# Patient Record
Sex: Female | Born: 1995 | Race: Black or African American | Hispanic: No | Marital: Single
Health system: Midwestern US, Community
[De-identification: ages and names within clinical notes are randomized; demographics above are authoritative.]

## PROBLEM LIST (undated history)

## (undated) ENCOUNTER — Emergency Department (HOSPITAL_COMMUNITY): Payer: Medicaid Other

## (undated) DIAGNOSIS — D649 Anemia, unspecified: Secondary | ICD-10-CM

## (undated) DIAGNOSIS — E559 Vitamin D deficiency, unspecified: Secondary | ICD-10-CM

## (undated) DIAGNOSIS — A749 Chlamydial infection, unspecified: Secondary | ICD-10-CM

## (undated) DIAGNOSIS — O98311 Other infections with a predominantly sexual mode of transmission complicating pregnancy, first trimester: Secondary | ICD-10-CM

## (undated) DIAGNOSIS — G43109 Migraine with aura, not intractable, without status migrainosus: Secondary | ICD-10-CM

## (undated) DIAGNOSIS — Z3490 Encounter for supervision of normal pregnancy, unspecified, unspecified trimester: Secondary | ICD-10-CM

## (undated) DIAGNOSIS — D509 Iron deficiency anemia, unspecified: Secondary | ICD-10-CM

## (undated) HISTORY — PX: NO PAST SURGERIES: SHX2092

---
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
## 2016-01-02 ENCOUNTER — Ambulatory Visit
Admit: 2016-01-02 | Payer: MEDICAID | Attending: Sports Medicine | Primary: Student in an Organized Health Care Education/Training Program

## 2016-01-02 DIAGNOSIS — G43109 Migraine with aura, not intractable, without status migrainosus: Secondary | ICD-10-CM

## 2016-01-02 LAB — POCT URINALYSIS DIPSTICK
Bilirubin, UA: NEGATIVE
Glucose, UA POC: NEGATIVE
Ketones, UA: NEGATIVE
Leukocytes, UA: NEGATIVE
Nitrite, UA: NEGATIVE
Urobilinogen, UA: 0.2
pH, UA: 6.5

## 2016-01-02 MED ORDER — SUMATRIPTAN SUCCINATE 25 MG PO TABS
25 MG | ORAL_TABLET | Freq: Once | ORAL | 1 refills | Status: DC | PRN
Start: 2016-01-02 — End: 2017-10-28

## 2016-01-02 MED ORDER — PROPRANOLOL HCL ER 80 MG PO CP24
80 MG | ORAL_CAPSULE | Freq: Every day | ORAL | 2 refills | Status: DC
Start: 2016-01-02 — End: 2017-10-28

## 2016-01-02 NOTE — Progress Notes (Signed)
Previous visit notes and plan of care reviewed: YES  Verified patient is taking prescribed medications: Yes  PHQ-2/PHQ-9 screening done: Yes  Falls risk assessment done: No  Assess immunization status for current needs: No  Preventative patient care summary generated: No  Care opportunities discussed: No  MyChart activation discussed: Yes

## 2016-01-02 NOTE — Progress Notes (Signed)
Subjective    Thomasa TERETHA CHALUPA is a 20 y.o. female who presents:    Chief Complaint   Patient presents with   ??? Emesis     Resolved     Vomiting  - 6x overnight around 12/27/15  - Now resolved  - Associated headache  - Reports history of migraine headaches  - Associated with photophobia and phonophobia  - Occurring around twice per month  - Associated with aura  - Thinks they occur around the same time of month around when she is due for her depo-provera birth control shot  - Of note son had likely viral gastroenteritis around the same time  - No fever  - No dysuria but she reports increased frequency since birth or son 7 months ago  - Currently on period    ROS  Patient Active Problem List   Diagnosis   ??? Pregnancy   ??? Polydactyly of toes   ??? UTI (urinary tract infection) during pregnancy   ??? Chlamydia infection affecting pregnancy in first trimester, antepartum   ??? Insufficient weight gain in pregnancy in second trimester   ??? Vitamin D deficiency   ??? 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 Trichomonal vaginitis   ??? Iron deficiency anemia     No Known Allergies  Current Outpatient Prescriptions   Medication Sig Dispense Refill   ??? propranolol (INDERAL LA) 80 MG extended release capsule Take 1 capsule by mouth daily 30 capsule 2   ??? SUMAtriptan (IMITREX) 25 MG tablet Take 1 tablet by mouth once as needed for Migraine (may repeat in 2 hours if headache not improved) 9 tablet 1   ??? ibuprofen (ADVIL;MOTRIN) 600 MG tablet Take 1 tablet by mouth every 6 hours as needed for Pain 120 tablet 3   ??? ferrous sulfate (FE TABS) 325 (65 FE) MG EC tablet Take 1 tablet by mouth 2 times daily 90 tablet 3   ??? Prenatal Vit-Fe Fumarate-FA (PRENATAL VITAMINS) 28-0.8 MG TABS Take 1 tablet by mouth daily SUBSTITUTE GENERIC PRENATAL VITAMIN PER PT'S INSURANCE. 30 tablet 11     Current Facility-Administered Medications   Medication Dose Route Frequency Provider Last Rate Last Dose   ??? medroxyPROGESTERone  (DEPO-PROVERA) injection 150 mg  150 mg Intramuscular Q3 Months Jacquelyne Balint, DO   150 mg at 10/19/15 1339     Social History   Substance Use Topics   ??? Smoking status: Never Smoker   ??? Smokeless tobacco: Not on file   ??? Alcohol use No       Objective     Visit Vitals   ??? BP 110/79 (Site: Right Arm, Position: Sitting, Cuff Size: Medium Adult)   ??? Pulse 90   ??? Temp 97.2 ??F (36.2 ??C) (Temporal)   ??? Resp 26   ??? Ht  (1.753 m)   ??? Wt 181 lb (82.1 kg)   ??? BMI 26.73 kg/m2     Physical Exam   Constitutional: She is oriented to person, place, and time. No distress.   HENT:   Head: Normocephalic.   Mouth/Throat: Oropharynx is clear and moist.   Cardiovascular: Normal rate and regular rhythm.  Exam reveals no gallop.    No murmur heard.  Pulmonary/Chest: Effort normal. No respiratory distress.   Abdominal: Soft. There is no tenderness.   Neurological: She is alert and oriented to person, place, and time. No cranial nerve deficit.   Skin: Skin is warm and dry. No rash noted.  Psychiatric: She has a normal mood and affect.     Laboratory:  Results for orders placed or performed in visit on 01/02/16   POCT Urinalysis no Micro   Result Value Ref Range    Color, UA yellow     Clarity, UA cloudy     Glucose, UA POC neg     Bilirubin, UA neg     Ketones, UA neg     Spec Grav, UA      Blood, UA POC large     pH, UA 6.5     Protein, UA POC trace     Urobilinogen, UA 0.2     Leukocytes, UA neg     Nitrite, UA neg      Assessment/Plan     1. Migraine with aura and without status migrainosus, not intractable  - Will start on prophylactic migraine medication as patient having at least 2 migraine headaches weekly  - Likely hormonal related and correlated with cycle/depo shots.  Patient declined change in brith control today.  - Start propranolol (INDERAL LA) 80 MG extended release capsule; Take 1 capsule by mouth daily  Dispense: 30 capsule; Refill: 2  - Start SUMAtriptan (IMITREX) 25 MG tablet; Take 1 tablet by mouth once as needed  for Migraine (may repeat in 2 hours if headache not improved)  Dispense: 9 tablet; Refill: 1    2. Non-intractable vomiting with nausea, unspecified vomiting type  - Due to above    3. Urinary frequency  - POCT Urinalysis no Micro  - Positive for blood and protein  - Currently on period    [place] Shanterria Franta, DO  01/02/16  1:14 PM

## 2016-01-02 NOTE — Patient Instructions (Signed)
Migraine Headache: Care Instructions  Your Care Instructions  Migraines are painful, throbbing headaches that often start on one side of the head. They may cause nausea and vomiting and make you sensitive to light, sound, or smell.  Without treatment, migraines can last from 4 hours to a few days. Medicines can help prevent migraines or stop them after they have started. Your doctor can help you find which ones work best for you.  Follow-up care is a key part of your treatment and safety. Be sure to make and go to all appointments, and call your doctor if you are having problems. It's also a good idea to know your test results and keep a list of the medicines you take.  How can you care for yourself at home?  ?? Do not drive if you have taken a prescription pain medicine.  ?? Rest in a quiet, dark room until your headache is gone. Close your eyes, and try to relax or go to sleep. Don't watch TV or read.  ?? Put a cold, moist cloth or cold pack on the painful area for 10 to 20 minutes at a time. Put a thin cloth between the cold pack and your skin.  ?? Use a warm, moist towel or a heating pad set on low to relax tight shoulder and neck muscles.  ?? Have someone gently massage your neck and shoulders.  ?? Take your medicines exactly as prescribed. Call your doctor if you think you are having a problem with your medicine. You will get more details on the specific medicines your doctor prescribes.  ?? Be careful not to take pain medicine more often than the instructions allow. You could get worse or more frequent headaches when the medicine wears off.  To prevent migraines  ?? Keep a headache diary so you can figure out what triggers your headaches. Avoiding triggers may help you prevent headaches. Record when each headache began, how long it lasted, and what the pain was like. (Was it throbbing, aching, stabbing, or dull?) Write down any other symptoms you had with the headache, such as nausea, flashing lights or dark  spots, or sensitivity to bright light or loud noise. Note if the headache occurred near your period. List anything that might have triggered the headache. Triggers may include certain foods (chocolate, cheese, wine) or odors, smoke, bright light, stress, or lack of sleep.  ?? If your doctor has prescribed medicine for your migraines, take it as directed. You may have medicine that you take only when you get a migraine and medicine that you take all the time to help prevent migraines.  ?? If your doctor has prescribed medicine for when you get a headache, take it at the first sign of a migraine, unless your doctor has given you other instructions.  ?? If your doctor has prescribed medicine to prevent migraines, take it exactly as prescribed. Call your doctor if you think you are having a problem with your medicine.  ?? Find healthy ways to deal with stress. Migraines are most common during or right after stressful times. Take time to relax before and after you do something that has caused a migraine in the past.  ?? Try to keep your muscles relaxed by keeping good posture. Check your jaw, face, neck, and shoulder muscles for tension. Try to relax them. When you sit at a desk, change positions often. And make sure to stretch for 30 seconds each hour.  ?? Get plenty of sleep and   exercise.  ?? Eat meals on a regular schedule. Avoid foods and drinks that often trigger migraines. These include chocolate, alcohol (especially red wine and port), aspartame, monosodium glutamate (MSG), and some additives found in foods (such as hot dogs, bacon, cold cuts, aged cheeses, and pickled foods).  ?? Limit caffeine. Don't drink too much coffee, tea, or soda. But don't quit caffeine suddenly. That can also give you migraines.  ?? Do not smoke or allow others to smoke around you. If you need help quitting, talk to your doctor about stop-smoking programs and medicines. These can increase your chances of quitting for good.  ?? If you are taking  birth control pills or hormone therapy, talk to your doctor about whether they are triggering your migraines.  When should you call for help?  Call 911 anytime you think you may need emergency care. For example, call if:  ?? You have signs of a stroke. These may include:  ?? Sudden numbness, paralysis, or weakness in your face, arm, or leg, especially on only one side of your body.  ?? Sudden vision changes.  ?? Sudden trouble speaking.  ?? Sudden confusion or trouble understanding simple statements.  ?? Sudden problems with walking or balance.  ?? A sudden, severe headache that is different from past headaches.  Call your doctor now or seek immediate medical care if:  ?? You have new or worse nausea and vomiting.  ?? You have a new or higher fever.  ?? Your headache gets much worse.  Watch closely for changes in your health, and be sure to contact your doctor if:  ?? You are not getting better after 2 days (48 hours).  Where can you learn more?  Go to [email or web address] and sign in to your MyChart account. Enter U690 in the Search Health Information box to learn more about "Migraine Headache: Care Instructions."     If you do not have an account, please click on the "Sign Up Now" link.  Current as of: December 31, 2014  Content Version: 11.1  ?? 2006-2016 [employer]. Care instructions adapted under license by [facility]. If you have questions about a medical condition or this instruction, always ask your healthcare professional. [employer] disclaims any warranty or liability for your use of this information.

## 2016-01-03 NOTE — Progress Notes (Signed)
Resident note reviewed. Agree with plan.

## 2016-01-04 ENCOUNTER — Encounter: Primary: Student in an Organized Health Care Education/Training Program

## 2016-01-11 ENCOUNTER — Encounter: Primary: Student in an Organized Health Care Education/Training Program

## 2016-01-11 NOTE — Progress Notes (Signed)
Pt advised to increase Ca+ in diet thru supplements or diet. Next depo appointment scheduled 12 weeks from now.

## 2016-03-28 ENCOUNTER — Encounter: Primary: Student in an Organized Health Care Education/Training Program

## 2016-04-20 ENCOUNTER — Encounter
Admit: 2016-04-20 | Discharge: 2016-04-20 | Payer: MEDICAID | Primary: Student in an Organized Health Care Education/Training Program

## 2016-04-20 DIAGNOSIS — Z3042 Encounter for surveillance of injectable contraceptive: Secondary | ICD-10-CM

## 2016-04-20 LAB — POCT URINE PREGNANCY: Preg Test, Ur: NEGATIVE

## 2016-04-20 NOTE — Other (Unsigned)
Patient Acct Nbr: [id number]   Primary AUTH/CERT:   Primary Insurance Company Name: [employer]  Primary Insurance Plan name: [facility]  Primary Insurance Group Number:   Primary Insurance Plan Type: Health  Primary Insurance Policy Number: [id number]

## 2016-04-20 NOTE — Progress Notes (Signed)
Pt is late by 22 days for Depo Provera injection. Quick start protocol. LMP 6/2-6/7. Protected intercourse. Negative HCG test today. Return in 2 weeks for repeat HCG urine pregnancy test. Pt stated understanding. JLR

## 2016-05-03 ENCOUNTER — Encounter: Primary: Student in an Organized Health Care Education/Training Program

## 2016-05-09 MED FILL — MEDROXYPROGESTERONE ACETATE 150 MG/ML IM SUSP: 150 MG/ML | INTRAMUSCULAR | Qty: 1

## 2016-07-06 ENCOUNTER — Encounter: Primary: Student in an Organized Health Care Education/Training Program

## 2016-07-12 ENCOUNTER — Encounter
Admit: 2016-07-12 | Discharge: 2016-07-12 | Payer: MEDICAID | Primary: Student in an Organized Health Care Education/Training Program

## 2016-07-12 DIAGNOSIS — Z3042 Encounter for surveillance of injectable contraceptive: Secondary | ICD-10-CM

## 2016-07-12 MED ORDER — MEDROXYPROGESTERONE ACETATE 150 MG/ML IM SUSP
150 | INTRAMUSCULAR | Status: AC
Start: 2016-07-12 — End: 2016-07-12

## 2016-07-12 MED FILL — MEDROXYPROGESTERONE ACETATE 150 MG/ML IM SUSP: 150 MG/ML | INTRAMUSCULAR | Qty: 1

## 2016-07-12 NOTE — Progress Notes (Signed)
Patient here for depo injection next due November 16-October 11, 2016 appt scheduled patient voiced no questions or concerns

## 2016-07-12 NOTE — Other (Unsigned)
Patient Acct Nbr: [id number]   Primary AUTH/CERT:   Primary Insurance Company Name: [employer]  Primary Insurance Plan name: [facility]  Primary Insurance Group Number:   Primary Insurance Plan Type: Health  Primary Insurance Policy Number: [id number]

## 2016-08-07 ENCOUNTER — Inpatient Hospital Stay: Admit: 2016-08-07 | Discharge: 2016-08-07 | Disposition: A | Discharge status: Home / Self Care

## 2016-08-07 ENCOUNTER — Encounter

## 2016-08-07 DIAGNOSIS — J309 Allergic rhinitis, unspecified: Secondary | ICD-10-CM

## 2016-08-07 MED ORDER — IBUPROFEN 600 MG PO TABS
600 MG | Freq: Once | ORAL | Status: DC
Start: 2016-08-07 — End: 2016-08-07

## 2016-08-07 MED ORDER — FERROUS SULFATE 325 (65 FE) MG PO TBEC
325 | ORAL_TABLET | Freq: Two times a day (BID) | ORAL | 3 refills | Status: DC
Start: 2016-08-07 — End: 2017-10-30

## 2016-08-07 MED ORDER — CETIRIZINE HCL 10 MG PO TABS
10 MG | Freq: Every day | ORAL | Status: DC
Start: 2016-08-07 — End: 2016-08-07

## 2016-08-07 MED ORDER — OXYMETAZOLINE HCL 0.05 % NA SOLN
0.05 % | Freq: Once | NASAL | Status: DC
Start: 2016-08-07 — End: 2016-08-07

## 2016-08-07 MED ORDER — LORATADINE 10 MG PO TABS
10 MG | ORAL_TABLET | Freq: Every day | ORAL | 0 refills | Status: AC
Start: 2016-08-07 — End: 2016-08-22

## 2016-08-07 MED ORDER — GUAIFENESIN 400 MG PO TABS
400 MG | ORAL_TABLET | Freq: Four times a day (QID) | ORAL | 0 refills | Status: AC | PRN
Start: 2016-08-07 — End: 2016-08-12

## 2016-08-07 MED ORDER — IBUPROFEN 600 MG PO TABS
600 MG | ORAL_TABLET | Freq: Four times a day (QID) | ORAL | 0 refills | Status: DC | PRN
Start: 2016-08-07 — End: 2017-10-30

## 2016-08-07 MED ORDER — GUAIFENESIN ER 600 MG PO TB12
600 MG | Freq: Two times a day (BID) | ORAL | Status: DC
Start: 2016-08-07 — End: 2016-08-07

## 2016-08-07 MED ORDER — PRENATAL VITAMINS 28-0.8 MG PO TABS
28-0.8 | ORAL_TABLET | Freq: Every day | ORAL | 11 refills | Status: DC
Start: 2016-08-07 — End: 2017-10-30

## 2016-08-07 NOTE — ED Notes (Signed)
Bed: 02  Expected date:   Expected time:   Means of arrival:   Comments:  Triage: Barbera Setters A. Luanna Salk, RN  08/07/16 [phone]

## 2016-08-07 NOTE — Telephone Encounter (Signed)
Name of Caller: [name]    Relationship to Patient: patient    Contact phone number:  [phone]    Medication Name:Prenatal Vit-Fe Fumarate-FA (PRENATAL VITAMINS) , ferrous sulfate (FE TABS) 325 (65 FE) MG EC tablet    Ordering Physician: Lucretia RoersWood    Pharmacy Name:  [facility]     Pharmacy Address:  Verified     Pharmacy Phone Number: [phone]    Updated/Validated Preferred Pharmacy: Yes    Patient instructed to contact the pharmacy prior to picking up the medication: Yes    Patient advised that office/PCP has 24-48 business hours to return their call: Yes

## 2016-08-07 NOTE — ED Notes (Signed)
Patient discharged home in no disress and without complaints.  Patient given prescriptions and follow up instructions. Patient with no questions at time of dischare     Lonzo CloudScott M. Ladona Ridgelaylor, RN  08/07/16 [phone]

## 2016-08-07 NOTE — Other (Unsigned)
Patient Acct Nbr: [id number]   Primary AUTH/CERT:   Primary Insurance Company Name: [employer]  Primary [facility] Plan [facility]: [facility]  Primary Insurance Group Number:   Primary Insurance Plan Type: Health  Primary Insurance Policy Number: [id number]

## 2016-08-07 NOTE — Telephone Encounter (Signed)
Escript sent.  Please call patient to schedule a wellness visit with PCP ( or [facility]) within 3 months.  Thank you

## 2016-08-07 NOTE — Telephone Encounter (Signed)
Patient scheduled 09/18/16

## 2016-08-07 NOTE — Discharge Instructions (Signed)
Allergies: Care Instructions  Your Care Instructions  Allergies occur when your body's defense system (immune system) overreacts to certain substances. The immune system treats a harmless substance as if it were a harmful germ or virus. Many things can cause this overreaction, including pollens, medicine, food, dust, animal dander, and mold.  Allergies can be mild or severe. Mild allergies can be managed with home treatment. But medicine may be needed to prevent problems.  Managing your allergies is an important part of staying healthy. Your doctor may suggest that you have allergy testing to help find out what is causing your allergies. When you know what things trigger your symptoms, you can avoid them. This can prevent allergy symptoms and other health problems.  For severe allergies that cause reactions that affect your whole body (anaphylactic reactions), your doctor may prescribe a shot of epinephrine to carry with you in case you have a severe reaction. Learn how to give yourself the shot and keep it with you at all times. Make sure it is not expired.  Follow-up care is a key part of your treatment and safety. Be sure to make and go to all appointments, and call your doctor if you are having problems. It's also a good idea to know your test results and keep a list of the medicines you take.  How can you care for yourself at home?   If you have been told by your doctor that dust or dust mites are causing your allergy, decrease the dust around your bed:   Wash sheets, pillowcases, and other bedding in hot water every week.   Use dust-proof covers for pillows, duvets, and mattresses. Avoid plastic covers because they tear easily and do not "breathe." Wash as instructed on the label.   Do not use any blankets and pillows that you do not need.   Use blankets that you can wash in your washing machine.   Consider removing drapes and carpets, which attract and hold dust, from your bedroom.   If you are  allergic to house dust and mites, do not use home humidifiers. Your doctor can suggest ways you can control dust and mites.   Look for signs of cockroaches. Cockroaches cause allergic reactions. Use cockroach baits to get rid of them. Then, clean your home well. Cockroaches like areas where grocery bags, newspapers, empty bottles, or cardboard boxes are stored. Do not keep these inside your home, and keep trash and food containers sealed. Seal off any spots where cockroaches might enter your home.   If you are allergic to mold, get rid of furniture, rugs, and drapes that smell musty. Check for mold in the bathroom.   If you are allergic to outdoor pollen or mold spores, use air-conditioning. Change or clean all filters every month. Keep windows closed.   If you are allergic to pollen, stay inside when pollen counts are high. Use a vacuum cleaner with a HEPA filter or a double-thickness filter at least two times each week.   Stay inside when air pollution is bad. Avoid paint fumes, perfumes, and other strong odors.   Avoid conditions that make your allergies worse. Stay away from smoke. Do not smoke or let anyone else smoke in your house. Do not use fireplaces or wood-burning stoves.   If you are allergic to your pets, change the air filter in your furnace every month. Use high-efficiency filters.   If you are allergic to pet dander, keep pets outside or out of your bedroom.   Old carpet and cloth furniture can hold a lot of animal dander. You may need to replace them.  When should you call for help?  Give an epinephrine shot if:   You think you are having a severe allergic reaction.   You have symptoms in more than one body area, such as mild nausea and an itchy mouth.  After giving an epinephrine shot call 911, even if you feel better.  Call 911 if:   You have symptoms of a severe allergic reaction. These may include:   Sudden raised, red areas (hives) all over your body.   Swelling of the throat, mouth,  lips, or tongue.   Trouble breathing.   Passing out (losing consciousness). Or you may feel very lightheaded or suddenly feel weak, confused, or restless.   You have been given an epinephrine shot, even if you feel better.  Call your doctor now or seek immediate medical care if:   You have symptoms of an allergic reaction, such as:   A rash or hives (raised, red areas on the skin).   Itching.   Swelling.   Belly pain, nausea, or vomiting.  Watch closely for changes in your health, and be sure to contact your doctor if:   You do not get better as expected.  Where can you learn more?  Go to [email or web address] and sign in to your MyChart account. Enter [phone] in the Search Health Information box to learn more about "Allergies: Care Instructions."     If you do not have an account, please click on the "Sign Up Now" link.  Current as of: February 13, 2016  Content Version: 11.3   2006-2017 [employer]. Care instructions adapted under license by [facility]. If you have questions about a medical condition or this instruction, always ask your healthcare professional. [employer] disclaims any warranty or liability for your use of this information.         Rhinitis: Care Instructions  Your Care Instructions  Rhinitis is swelling and irritation in the nose. Allergies and infections are often the cause. Your nose may run or feel stuffy. Other symptoms are itchy and sore eyes, ears, throat, and mouth.  If allergies are the cause, your doctor may do tests to find out what you are allergic to. You may be able to stop symptoms if you avoid the things that cause them. Your doctor may suggest or prescribe medicine to ease your symptoms.  Follow-up care is a key part of your treatment and safety. Be sure to make and go to all appointments, and call your doctor if you are having problems. It's also a good idea to know your test results and keep a list of the medicines you take.  How  can you care for yourself at home?   If your rhinitis is caused by allergies, try to find out what sets off (triggers) your symptoms. Take steps to avoid these triggers.   Avoid yard work. It can stir up both pollen and mold.   Do not smoke or allow others to smoke around you. If you need help quitting, talk to your doctor about stop-smoking programs and medicines. These can increase your chances of quitting for good.   Do not use aerosol sprays, cleaning products, or perfumes.   If pollen is one of your triggers, close your house and car windows during blooming season.   Clean your house often to control dust.   Keep pets outside.   If your doctor  recommends over-the-counter medicines to relieve symptoms, take your medicines exactly as prescribed. Call your doctor if you think you are having a problem with your medicine.   Use saline (saltwater) nasal washes to help keep your nasal passages open and wash out mucus and bacteria. You can buy saline nose drops at a grocery store or drugstore. Or you can make your own at home by adding 1 teaspoon of salt and 1 teaspoon of baking soda to 2 cups of distilled water. If you make your own, fill a bulb syringe with the solution, insert the tip into your nostril, and squeeze gently. Blow your nose.  When should you call for help?  Call your doctor now or seek immediate medical care if:   You are having trouble breathing.  Watch closely for changes in your health, and be sure to contact your doctor if:   Mucus from your nose gets thicker (like pus) or has new blood in it.   You have new or worse symptoms.   You do not get better as expected.  Where can you learn more?  Go to [email or web address] and sign in to your MyChart account. Enter [id number] in the Search Health Information box to learn more about "Rhinitis: Care Instructions."     If you do not have an account, please click on the "Sign Up Now" link.  Current as of: June 10, 2015  Content  Version: 11.3   2006-2017 [employer]. Care instructions adapted under license by [facility]. If you have questions about a medical condition or this instruction, always ask your healthcare professional. [employer] disclaims any warranty or liability for your use of this information.         Using a Nasal Steroid Spray: Care Instructions  Your Care Instructions    Your doctor may suggest using a corticosteroid nasal spray for your allergy symptoms or sinus problems.  These sprays reduce the swelling inside the nose and sinuses. Unlike decongestant nasal sprays, steroid sprays won't lead to more swelling when you stop taking them.  These sprays start working in a few days, but it may take several weeks before you get the full effect.  Most side effects are minor. The most common complaint is a burning feeling in the nose right after the spray is used. Some people get nosebleeds.  Follow-up care is a key part of your treatment and safety. Be sure to make and go to all appointments, and call your doctor if you are having problems. It's also a good idea to know your test results and keep a list of the medicines you take.  How can you care for yourself at home?  Here are some tips for using these sprays:   You may need to prime the sprayer before you use it. This means spraying it into the air a few times to make sure you get the right amount of medicine. Follow the directions on the label.   Blow your nose before you spray. This will help clear out your nostrils.   Gently sniff the medicine into your nose as you spray. Don't snort, or the medicine will go all the way into your throat where it won't do much good.   Aim the nozzle straight toward the outer wall of your nostril. This will help keep the medicine from irritating the inner walls of your nose, especially your septum (the wall that separates your left and right nostrils).   Don't blow your nose for 10 minutes  or so after you  spray. And try not to sneeze.   Be safe with medicines. Use this medicine exactly as prescribed. Call your doctor if you think you are having a problem with your medicine.   Clean your sprayer once a week. Read the label to learn how.  When should you call for help?  Watch closely for changes in your health, and be sure to contact your doctor if you have any problems.  Where can you learn more?  Go to [email or web address] and sign in to your MyChart account. Enter [phone] in the Search Health Information box to learn more about "Using a Nasal Steroid Spray: Care Instructions."     If you do not have an account, please click on the "Sign Up Now" link.  Current as of: August 02, 2015  Content Version: 11.3   2006-2017 [employer]. Care instructions adapted under license by [facility]. If you have questions about a medical condition or this instruction, always ask your healthcare professional. [employer] disclaims any warranty or liability for your use of this information.         Managing Your Allergies: Care Instructions  Your Care Instructions  Managing your allergies is an important part of staying healthy. Your doctor will help you find out what may be causing the allergies. Common causes of allergy symptoms are house dust and dust mites, animal dander, mold, and pollen.  As soon as you know what triggers your symptoms, try to reduce your exposure to your triggers. This can help prevent allergy symptoms, asthma, and other health problems.  Ask your doctor about allergy medicine or immunotherapy. These treatments may help reduce or prevent allergy symptoms.  Follow-up care is a key part of your treatment and safety. Be sure to make and go to all appointments, and call your doctor if you are having problems. It's also a good idea to know your test results and keep a list of the medicines you take.  How can you care for yourself at home?   If you think that dust or  dust mites are causing your allergies:   Wash sheets, pillowcases, and other bedding every week in hot water.   Use airtight, dust-proof covers for pillows, duvets, and mattresses. Avoid plastic covers, because they tend to tear quickly and do not "breathe." Wash according to the instructions.   Remove extra blankets and pillows that you don't need.   Use blankets that are machine-washable.   Don't use home humidifiers. They can help mites live longer.   Use air-conditioning. Change or clean all filters every month. Keep windows closed. Use high-efficiency air filters. Don't use window or attic fans, which draw dust into the air.   If you're allergic to pet dander, keep pets outside or, at the very least, out of your bedroom. Old carpet and cloth-covered furniture can hold a lot of animal dander. You may need to replace them.   Look for signs of cockroaches. Use cockroach baits to get rid of them. Then clean your home well.   If you're allergic to mold, don't keep indoor plants, because molds can grow in soil. Get rid of furniture, rugs, and drapes that smell musty. Check for mold in the bathroom.   If you're allergic to pollen, stay inside when pollen counts are high.   Don't smoke or let anyone else smoke in your house. Don't use fireplaces or wood-burning stoves. Avoid paint fumes, perfumes, and other strong odors.  When should you  call for help?  Give an epinephrine shot if:   You think you are having a severe allergic reaction.  After giving an epinephrine shot call 911, even if you feel better.  Call 911 if:   You have symptoms of a severe allergic reaction. These may include:   Sudden raised, red areas (hives) all over your body.   Swelling of the throat, mouth, lips, or tongue.   Trouble breathing.   Passing out (losing consciousness). Or you may feel very lightheaded or suddenly feel weak, confused, or restless.   You have been given an epinephrine shot, even if you feel better.  Call your  doctor now or seek immediate medical care if:   You have symptoms of an allergic reaction, such as:   A rash or hives (raised, red areas on the skin).   Itching.   Swelling.   Belly pain, nausea, or vomiting.  Watch closely for changes in your health, and be sure to contact your doctor if:   Your allergies get worse.   You need help controlling your allergies.   You have questions about allergy testing.   You do not get better as expected.  Where can you learn more?  Go to [email or web address] and sign in to your MyChart account. Enter L249 in the Search Health Information box to learn more about "Managing Your Allergies: Care Instructions."     If you do not have an account, please click on the "Sign Up Now" link.  Current as of: February 13, 2016  Content Version: 11.3   2006-2017 [employer]. Care instructions adapted under license by [facility]. If you have questions about a medical condition or this instruction, always ask your healthcare professional. [employer] disclaims any warranty or liability for your use of this information.

## 2016-08-07 NOTE — ED Provider Notes (Signed)
Emergency Department Encounter  [facility] EMERGENCY DEPT    Patient: Lynn Welch  MRN: [id number]  DOB: 04/05/1996  Date of Evaluation: 08/07/2016  ED APP Provider: Durwin Nora, CNP    Chief Complaint       Chief Complaint   Patient presents with   ??? Pharyngitis     HOPI     Lynn Welch is a 20 y.o. female who presents to the emergency department With complaints of nasal congestion, sore throat ??2 days. Patient states that she had similar symptoms roughly a year ago and presents to the ED, was given Mucinex which worked well for her. Patient denies attempting any medications at home. Nothing makes her symptoms better or worse. Patient denies any fever, chills, nausea, vomiting, headache, neck pain, ear pain, shortness of breath or chest pain. No cough.    ROS:     Review of Systems  At least 5 systems reviewed and otherwise acutely negative except as in the HOPI.    Past History     Past Medical History:   Diagnosis Date   ??? 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 Iron deficiency anemia 05/13/2015   ??? Polydactyly of toes     right foot, tied off as infant   ??? Pregnancy 09/21/2014   ??? Spontaneous vaginal delivery 05/11/2015   ??? UTI (urinary tract infection) during pregnancy     Treated with Keflex     No past surgical history on file.  Social History     Social History   ??? Marital status: Single     Spouse name: N/A   ??? Number of children: N/A   ??? Years of education: N/A     Social History Main Topics   ??? Smoking status: Never Smoker   ??? Smokeless tobacco: Not on file   ??? Alcohol use No   ??? Drug use: No   ??? Sexual activity: Yes     Partners: Male     Other Topics Concern   ??? Not on file     Social History Narrative     Medications/Allergies     Previous Medications    FERROUS SULFATE (FE TABS) 325 (65 FE) MG EC TABLET    Take 1 tablet by mouth 2 times daily    IBUPROFEN (ADVIL;MOTRIN) 600 MG TABLET    Take 1 tablet by mouth every 6 hours as needed for Pain    PRENATAL VIT-FE  FUMARATE-FA (PRENATAL VITAMINS) 28-0.8 MG TABS    Take 1 tablet by mouth daily SUBSTITUTE GENERIC PRENATAL VITAMIN PER PT'S INSURANCE.    PROPRANOLOL (INDERAL LA) 80 MG EXTENDED RELEASE CAPSULE    Take 1 capsule by mouth daily    SUMATRIPTAN (IMITREX) 25 MG TABLET    Take 1 tablet by mouth once as needed for Migraine (may repeat in 2 hours if headache not improved)     No Known Allergies     Physical Exam     ED Triage Vitals   BP Temp Temp src Pulse Resp SpO2 Height Weight   -- -- -- -- -- -- -- --               Physical Exam   Constitutional: She is oriented to person, place, and time. She appears well-developed and well-nourished. No distress.   HENT:   Head: Normocephalic and atraumatic.   Right Ear: External ear normal.   Left Ear: External ear normal.   Mouth/Throat: Oropharynx  is clear and moist. No oropharyngeal exudate.   Terminates red with copious clear nasal discharge.   Eyes: Conjunctivae and EOM are normal.   Neck: Normal range of motion. Neck supple.   Cardiovascular: Normal rate and regular rhythm.    Pulmonary/Chest: Effort normal and breath sounds normal. No respiratory distress. She has no wheezes. She has no rales.   Abdominal: Soft. There is no tenderness.   Musculoskeletal: Normal range of motion.   Lymphadenopathy:     She has no cervical adenopathy.   Neurological: She is alert and oriented to person, place, and time.   Skin: Skin is warm and dry. No rash noted. She is not diaphoretic.   Psychiatric: She has a normal mood and affect.     Diagnostics   Labs:  No results found for this visit on 08/07/16.  Radiographs:  No results found.    ED Course and MDM         In brief, Lynn Welch is a 20 y.o. female who presented to the emergency department With complaints of upper respiratory symptoms ??2 days. Patient is well-appearing, in no obvious distress, afebrile without tachycardia or hypoxia. Physical exam???lung sounds clear throughout without focal findings, turbinates erythematous with clear  copious nasal discharge. Oropharynx patent, tonsils 2+ equal and without exudates. Uvula midline. No cervical lymphadenopathy. No concerns for pneumonia, meningitis, sinusitis. Most likely ALLERGIC rhinitis as patient has similar symptoms in the past which were relieved with Mucinex. We will order Afrin, Mucinex, Motrin and Claritin and discharged home with prescriptions. Patient advised of red flag conjunctivae clear she should report back to ED immediately. Recommend follow-up with PCP. Patient updated on plan of care, expressed or seen and agreeable to discharge home. Patient was given work note for return to work today.    ED Medication Orders     Start Ordered     Status Ordering Provider    08/07/16 0915 08/07/16 0902  oxymetazoline (AFRIN) 0.05 % nasal spray 2 spray  ONCE      Acknowledged Arpi Diebold A    08/07/16 0915 08/07/16 0902  guaiFENesin (MUCINEX) extended release tablet 600 mg  2 TIMES DAILY      Acknowledged Julio Zappia A    08/07/16 0915 08/07/16 0902  ibuprofen (ADVIL;MOTRIN) tablet 600 mg  ONCE      Acknowledged Lacreasha Hinds A    08/07/16 0915 08/07/16 0902  cetirizine (ZYRTEC) tablet 10 mg  DAILY      Acknowledged Meilyn Heindl A        Final Impression      1. Allergic rhinitis, unspecified allergic rhinitis trigger, unspecified rhinitis seasonality        DISPOSITION Decision to Discharge   Patient seen independently with an Emergency Medicine attending available for supervision.    (Please note that portions of this note may have been completed with a voice recognition program. Efforts were made to edit the dictations but occasionally words are mis-transcribed.)    Durwin NoraAmanda A Anglea Gordner, CNP  [employer]     Durwin Noramanda A Cylah Fannin, CNP  08/07/16 [phone]

## 2016-09-18 ENCOUNTER — Encounter
Attending: Student in an Organized Health Care Education/Training Program | Primary: Student in an Organized Health Care Education/Training Program

## 2016-09-21 ENCOUNTER — Encounter
Attending: Student in an Organized Health Care Education/Training Program | Primary: Student in an Organized Health Care Education/Training Program

## 2016-09-28 ENCOUNTER — Encounter: Primary: Student in an Organized Health Care Education/Training Program

## 2016-10-11 ENCOUNTER — Encounter
Admit: 2016-10-11 | Discharge: 2016-10-11 | Payer: MEDICAID | Primary: Student in an Organized Health Care Education/Training Program

## 2016-10-11 DIAGNOSIS — Z3042 Encounter for surveillance of injectable contraceptive: Secondary | ICD-10-CM

## 2016-10-11 MED ORDER — MEDROXYPROGESTERONE ACETATE 150 MG/ML IM SUSP
150 | INTRAMUSCULAR | Status: AC
Start: 2016-10-11 — End: 2016-10-11

## 2016-10-11 MED ORDER — MEDROXYPROGESTERONE ACETATE 150 MG/ML IM SUSP
150 MG/ML | INTRAMUSCULAR | Status: DC
Start: 2016-10-11 — End: 2017-05-10
  Administered 2016-10-11: 17:00:00 150 mg via INTRAMUSCULAR

## 2016-10-11 MED FILL — MEDROXYPROGESTERONE ACETATE 150 MG/ML IM SUSP: 150 MG/ML | INTRAMUSCULAR | Qty: 1

## 2016-10-11 NOTE — Other (Unsigned)
Patient Acct Nbr: [id number]   Primary AUTH/CERT:   Primary Insurance Company Name: [employer]  Primary [place] Plan name[place] [facility] [place] [facility]  Primary Insurance Group Number:   Primary Insurance Plan Type: Health  Primary Insurance Policy Number: [id number]

## 2016-10-11 NOTE — Progress Notes (Signed)
Here for RN visit depo-provera. Last depo given 07/12/16. Next depo scheduled for 01/03/17. Denies questions/concerns about medication.  Received flu shot at work.Patientwas able to ambulate safely to the examination room.     Provider was not notified of possible fall risk

## 2017-01-03 ENCOUNTER — Encounter: Primary: Student in an Organized Health Care Education/Training Program

## 2017-03-19 ENCOUNTER — Encounter (HOSPITAL_COMMUNITY): Payer: Self-pay

## 2017-03-19 ENCOUNTER — Emergency Department (HOSPITAL_COMMUNITY): Payer: Self-pay

## 2017-03-19 ENCOUNTER — Emergency Department (HOSPITAL_COMMUNITY)
Admission: EM | Admit: 2017-03-19 | Discharge: 2017-03-20 | Disposition: A | Discharge status: Home / Self Care | Payer: Self-pay | Attending: Emergency Medicine | Admitting: Emergency Medicine

## 2017-03-19 ENCOUNTER — Other Ambulatory Visit: Payer: Self-pay | Admitting: Diagnostic Radiology

## 2017-03-19 DIAGNOSIS — R059 Cough, unspecified: Secondary | ICD-10-CM

## 2017-03-19 DIAGNOSIS — F17290 Nicotine dependence, other tobacco product, uncomplicated: Secondary | ICD-10-CM | POA: Insufficient documentation

## 2017-03-19 DIAGNOSIS — R05 Cough: Secondary | ICD-10-CM | POA: Insufficient documentation

## 2017-03-19 DIAGNOSIS — N390 Urinary tract infection, site not specified: Secondary | ICD-10-CM | POA: Insufficient documentation

## 2017-03-19 DIAGNOSIS — J029 Acute pharyngitis, unspecified: Secondary | ICD-10-CM | POA: Insufficient documentation

## 2017-03-19 LAB — URINALYSIS, ROUTINE W REFLEX MICROSCOPIC
Bilirubin Urine: NEGATIVE
Glucose, UA: NEGATIVE mg/dL
Ketones, ur: 5 mg/dL — AB
Nitrite: POSITIVE — AB
PH: 5 (ref 5.0–8.0)
Protein, ur: 100 mg/dL — AB
SPECIFIC GRAVITY, URINE: 1.011 (ref 1.005–1.030)

## 2017-03-19 LAB — CBC
HCT: 39.8 % (ref 36.0–46.0)
Hemoglobin: 13 g/dL (ref 12.0–15.0)
MCH: 26.7 pg (ref 26.0–34.0)
MCHC: 32.7 g/dL (ref 30.0–36.0)
MCV: 81.9 fL (ref 78.0–100.0)
PLATELETS: 272 10*3/uL (ref 150–400)
RBC: 4.86 MIL/uL (ref 3.87–5.11)
RDW: 14.9 % (ref 11.5–15.5)
WBC: 19 10*3/uL — ABNORMAL HIGH (ref 4.0–10.5)

## 2017-03-19 LAB — COMPREHENSIVE METABOLIC PANEL
ALK PHOS: 89 U/L (ref 38–126)
ALT: 19 U/L (ref 14–54)
AST: 22 U/L (ref 15–41)
Albumin: 3.6 g/dL (ref 3.5–5.0)
Anion gap: 10 (ref 5–15)
BUN: 7 mg/dL (ref 6–20)
CALCIUM: 9.4 mg/dL (ref 8.9–10.3)
CHLORIDE: 101 mmol/L (ref 101–111)
CO2: 29 mmol/L (ref 22–32)
CREATININE: 0.94 mg/dL (ref 0.44–1.00)
GFR calc Af Amer: >60 mL/min (ref >60)
Glucose, Bld: 103 mg/dL — ABNORMAL HIGH (ref 65–99)
Potassium: 3.3 mmol/L — ABNORMAL LOW (ref 3.5–5.1)
Sodium: 140 mmol/L (ref 135–145)
Total Bilirubin: 0.6 mg/dL (ref 0.3–1.2)
Total Protein: 8.2 g/dL — ABNORMAL HIGH (ref 6.5–8.1)

## 2017-03-19 LAB — I-STAT BETA HCG BLOOD, ED (MC, WL, AP ONLY)

## 2017-03-19 LAB — LIPASE, BLOOD: Lipase: 15 U/L (ref 11–51)

## 2017-03-19 LAB — RAPID STREP SCREEN (MED CTR MEBANE ONLY): STREPTOCOCCUS, GROUP A SCREEN (DIRECT): NEGATIVE

## 2017-03-19 MED ORDER — ONDANSETRON 4 MG PO TBDP
4.0000 mg | ORAL_TABLET | Freq: Once | ORAL | Status: AC | PRN
  Administered 2017-03-19: 4 mg via ORAL
  Filled 2017-03-19: qty 1

## 2017-03-19 MED ORDER — LIDOCAINE-EPINEPHRINE (PF) 2 %-1:200000 IJ SOLN
20.0000 mL | Freq: Once | INTRAMUSCULAR | Status: AC
  Administered 2017-03-19: 20 mL via INTRADERMAL
  Filled 2017-03-19: qty 20

## 2017-03-19 MED ORDER — DEXTROSE 5 % IV SOLN
1.0000 g | Freq: Once | INTRAVENOUS | Status: AC
  Administered 2017-03-19: 1 g via INTRAVENOUS
  Filled 2017-03-19: qty 10

## 2017-03-19 MED ORDER — IOPAMIDOL (ISOVUE-300) INJECTION 61%
INTRAVENOUS | Status: AC
  Filled 2017-03-19: qty 75

## 2017-03-19 MED ORDER — IBUPROFEN 800 MG PO TABS
800.0000 mg | ORAL_TABLET | Freq: Once | ORAL | Status: AC
  Administered 2017-03-19: 800 mg via ORAL
  Filled 2017-03-19: qty 1

## 2017-03-19 MED ORDER — SODIUM CHLORIDE 0.9 % IV BOLUS (SEPSIS)
1000.0000 mL | Freq: Once | INTRAVENOUS | Status: AC
  Administered 2017-03-19: 1000 mL via INTRAVENOUS

## 2017-03-19 NOTE — ED Triage Notes (Signed)
Patient c/o headache and vomiting x 7 days. Patient states that she has been off of her birth control x 2-3 months due to moving from [place]. Patient reports sensitivity to light.

## 2017-03-19 NOTE — ED Provider Notes (Signed)
[facility]-EMERGENCY DEPT Provider Note   CSN: [id number] Arrival date & time: 03/19/17  1335     History   Chief Complaint Chief Complaint  Patient presents with  . Migraine  . Emesis    HPI Jenny Schwartz is a 21 y.o. female.  The history is provided by the patient.  Headache   This is a new problem. Episode onset: 7 days. The problem occurs constantly. The problem has not changed since onset.Associated with: URI symptoms, N/V. The pain is located in the frontal and bilateral region. The quality of the pain is described as dull. The pain is moderate. The pain does not radiate. Associated symptoms include a fever (subjective), malaise/fatigue, nausea and vomiting. Pertinent negatives include no shortness of breath. She has tried NSAIDs for the symptoms. The treatment provided mild relief.   No h/o HIV, syphilis, DM or other immunosuppressive states. No IVDU or EtOH use. No sick contacts.  Just moved from [place].   History reviewed. No pertinent past medical history.  There are no active problems to display for this patient.   History reviewed. No pertinent surgical history.  OB History    No data available       Home Medications    Prior to Admission medications   Medication Sig Start Date End Date Taking? Authorizing Provider  cetirizine (ZYRTEC) 10 MG tablet Take 1 tablet (10 mg total) by mouth at bedtime. 02/27/12 02/26/13  Moreno-Coll, Adlih, MD  Olopatadine HCl 0.2 % SOLN 2 gtts in affected eye tid prn for itchiness. Patient not taking: Reported on 03/19/2017 02/27/12   Moreno-Coll, Christin FudgeAdlih, MD    Family History No family history on file.  Social History Social History  Substance Use Topics  . Smoking status: Current Some Day Smoker    Types: Cigars  . Smokeless tobacco: Never Used  . Alcohol use No     Allergies   Patient has no known allergies.   Review of Systems Review of Systems  Constitutional: Positive for fever (subjective) and malaise/fatigue.  HENT:  Positive for congestion and rhinorrhea.   Eyes: Positive for photophobia.  Respiratory: Positive for cough. Negative for shortness of breath.   Gastrointestinal: Positive for nausea and vomiting.  Genitourinary: Positive for urgency. Negative for dysuria.  Neurological: Positive for headaches.   All other systems are reviewed and are negative for acute change except as noted in the HPI   Physical Exam Updated Vital Signs BP 110/70 (BP Location: Right Arm)   Pulse 70   Temp 98.4 F (36.9 C) (Oral)   Resp 18   Ht 5\' 11"  (1.803 m)   Wt 195 lb (88.5 kg)   LMP 03/05/2017 (Approximate)   SpO2 100%   BMI 27.20 kg/m   Physical Exam  Constitutional: She is oriented to person, place, and time. She appears well-developed and well-nourished. No distress.  HENT:  Head: Normocephalic and atraumatic.  Nose: Nose normal.  Mouth/Throat: No posterior oropharyngeal erythema. Tonsils are 2+ on the right. Tonsils are 2+ on the left. Tonsillar exudate.  Eyes: Conjunctivae and EOM are normal. Pupils are equal, round, and reactive to light. Right eye exhibits no discharge. Left eye exhibits no discharge. No scleral icterus.  Neck: Normal range of motion. No muscular tenderness present. Neck rigidity present.  Cardiovascular: Normal rate and regular rhythm.  Exam reveals no gallop and no friction rub.   No murmur heard. Pulmonary/Chest: Effort normal and breath sounds normal. No stridor. No respiratory distress. She has no rales.  Abdominal:  Soft. She exhibits no distension. There is no tenderness.  Musculoskeletal: She exhibits no edema or tenderness.  Lymphadenopathy:       Head (left side): Submandibular and tonsillar adenopathy present.    She has cervical adenopathy.       Right cervical: Deep cervical adenopathy present.       Left cervical: Deep cervical adenopathy present.  Neurological: She is alert and oriented to person, place, and time.  Skin: Skin is warm and dry. No rash noted. She is  not diaphoretic. No erythema.  Psychiatric: She has a normal mood and affect.  Vitals reviewed.    ED Treatments / Results  Labs (all labs ordered are listed, but only abnormal results are displayed) Labs Reviewed  COMPREHENSIVE METABOLIC PANEL - Abnormal; Notable for the following:       Result Value   Potassium 3.3 (*)    Glucose, Bld 103 (*)    Total Protein 8.2 (*)    All other components within normal limits  CBC - Abnormal; Notable for the following:    WBC 19.0 (*)    All other components within normal limits  URINALYSIS, ROUTINE W REFLEX MICROSCOPIC - Abnormal; Notable for the following:    APPearance CLOUDY (*)    Hgb urine dipstick LARGE (*)    Ketones, ur 5 (*)    Protein, ur 100 (*)    Nitrite POSITIVE (*)    Leukocytes, UA LARGE (*)    Bacteria, UA MANY (*)    Squamous Epithelial / LPF 0-5 (*)    All other components within normal limits  RAPID STREP SCREEN (NOT AT [facility])  CULTURE, GROUP A STREP ([facility])  CSF CULTURE  LIPASE, BLOOD  CSF CELL COUNT WITH DIFFERENTIAL  CSF CELL COUNT WITH DIFFERENTIAL  PROTEIN AND GLUCOSE, CSF  HIV ANTIBODY (ROUTINE TESTING)  I-STAT BETA HCG BLOOD, ED ([facility], [facility], [facility] ONLY)    EKG  EKG Interpretation None       Radiology Dg Chest 2 View  Result Date: 03/19/2017 CLINICAL DATA:  Headache, shortness of breath, diaphoresis, chills, productive cough for the past 7 days. Headache and emesis. EXAM: CHEST  2 VIEW COMPARISON:  None. FINDINGS: Normal sized heart. Clear lungs. Minimal central peribronchial thickening. Normal appearing bones. IMPRESSION: Minimal bronchitic changes. Electronically Signed   By: Beckie Salts M.D.   On: 03/19/2017 18:24    Procedures Procedures (including critical care time)  Medications Ordered in ED Medications  ondansetron (ZOFRAN-ODT) disintegrating tablet 4 mg (4 mg Oral Given 03/19/17 1418)  sodium chloride 0.9 % bolus 1,000 mL (0 mLs Intravenous Stopped 03/19/17 1915)  ibuprofen (ADVIL,MOTRIN) tablet  800 mg (800 mg Oral Given 03/19/17 1758)  cefTRIAXone (ROCEPHIN) 1 g in dextrose 5 % 50 mL IVPB (0 g Intravenous Stopped 03/19/17 1944)  lidocaine-EPINEPHrine (XYLOCAINE W/EPI) 2 %-1:200000 (PF) injection 20 mL (20 mLs Intradermal Given by Other 03/19/17 2110)     Initial Impression / Assessment and Plan / ED Course  I have reviewed the triage vital signs and the nursing notes.  Pertinent labs & imaging results that were available during my care of the patient were reviewed by me and considered in my medical decision making (see chart for details).     Presentation concerning for possible pharyngitis, peritonsillar abscess, meningitis.  Rapid strep negative. Given the unilateral tenderness we'll obtain a CTA of the neck to assess for peritonsillar abscess.  Labs with significant leukocytosis at 19. Urine with evidence of urinary tract infection however patient  was not having any dysuria. She did report pelvic pressure prior to voiding and urgency.  Attempted to perform a lumbar puncture in order to assess for meningitis given the fact that the patient's initial symptoms began with headache and neck stiffness. Multiple attempts were unsuccessful. Requiring fluoroscopy-guided lumbar puncture.  Patient is currently pending the CSF analysis and CT of the neck.  Covered with Rocephin.  Patient care turned over to Dr Criss Alvine at 0000. Patient case and results discussed in detail; please see their note for further ED managment.       Nira Conn, MD 03/20/17 [phone]

## 2017-03-20 LAB — CSF CELL COUNT WITH DIFFERENTIAL
RBC Count, CSF: 0 /mm3
RBC Count, CSF: 78 /mm3 — ABNORMAL HIGH
TUBE #: 4
Tube #: 1
WBC, CSF: 2 /mm3 (ref 0–5)
WBC, CSF: 3 /mm3 (ref 0–5)

## 2017-03-20 LAB — PROTEIN AND GLUCOSE, CSF
Glucose, CSF: 67 mg/dL (ref 40–70)
Total  Protein, CSF: 19 mg/dL (ref 15–45)

## 2017-03-20 LAB — HIV ANTIBODY (ROUTINE TESTING W REFLEX): HIV Screen 4th Generation wRfx: NONREACTIVE

## 2017-03-20 MED ORDER — ACETAMINOPHEN 325 MG PO TABS
650.0000 mg | ORAL_TABLET | ORAL | Status: AC
  Administered 2017-03-20: 650 mg via ORAL
  Filled 2017-03-20: qty 2

## 2017-03-20 MED ORDER — IOPAMIDOL (ISOVUE-300) INJECTION 61%
75.0000 mL | Freq: Once | INTRAVENOUS | Status: AC | PRN
  Administered 2017-03-20: 75 mL via INTRAVENOUS

## 2017-03-20 MED ORDER — CEPHALEXIN 500 MG PO CAPS: 500.0000 mg | ORAL_CAPSULE | Freq: Two times a day (BID) | ORAL | 0 refills | Status: AC

## 2017-03-20 NOTE — ED Notes (Signed)
Patient returned from radiology, post LP. Assisted pt to bedpan. Positioned pt lying flat at this time and educated her on the same. Gave pt cracker and drink. Awaiting further evaluation.

## 2017-03-20 NOTE — ED Provider Notes (Signed)
Csf negative. She is feeling much better. CT neck c/w pharyngitis, no abscess or deep infection. Will place on antibiotics given the urine findings but the main complaints are probably viral. Strict return precautions.    Jenny Schwartz, Ellajane Stong, MD 03/20/17 1017

## 2017-03-21 ENCOUNTER — Encounter (HOSPITAL_COMMUNITY): Payer: Self-pay

## 2017-03-21 ENCOUNTER — Emergency Department (HOSPITAL_COMMUNITY)
Admission: EM | Admit: 2017-03-21 | Discharge: 2017-03-21 | Disposition: A | Discharge status: Home / Self Care | Payer: Self-pay | Attending: Emergency Medicine | Admitting: Emergency Medicine

## 2017-03-21 DIAGNOSIS — R519 Headache, unspecified: Secondary | ICD-10-CM

## 2017-03-21 DIAGNOSIS — Z79899 Other long term (current) drug therapy: Secondary | ICD-10-CM | POA: Insufficient documentation

## 2017-03-21 DIAGNOSIS — R51 Headache: Secondary | ICD-10-CM | POA: Insufficient documentation

## 2017-03-21 DIAGNOSIS — F17290 Nicotine dependence, other tobacco product, uncomplicated: Secondary | ICD-10-CM | POA: Insufficient documentation

## 2017-03-21 LAB — CULTURE, GROUP A STREP (THRC)

## 2017-03-21 LAB — CBG MONITORING, ED: GLUCOSE-CAPILLARY: 92 mg/dL (ref 65–99)

## 2017-03-21 MED ORDER — KETOROLAC TROMETHAMINE 30 MG/ML IJ SOLN
30.0000 mg | Freq: Once | INTRAMUSCULAR | Status: AC
  Administered 2017-03-21: 30 mg via INTRAVENOUS
  Filled 2017-03-21: qty 1

## 2017-03-21 MED ORDER — ONDANSETRON 4 MG PO TBDP: ORAL_TABLET | ORAL | 0 refills | Status: DC

## 2017-03-21 MED ORDER — TRAMADOL HCL 50 MG PO TABS: 50.0000 mg | ORAL_TABLET | Freq: Four times a day (QID) | ORAL | 0 refills | Status: DC | PRN

## 2017-03-21 MED ORDER — SODIUM CHLORIDE 0.9 % IV BOLUS (SEPSIS)
2000.0000 mL | Freq: Once | INTRAVENOUS | Status: AC
  Administered 2017-03-21: 2000 mL via INTRAVENOUS

## 2017-03-21 MED ORDER — METOCLOPRAMIDE HCL 5 MG/ML IJ SOLN
10.0000 mg | Freq: Once | INTRAMUSCULAR | Status: AC
  Administered 2017-03-21: 10 mg via INTRAVENOUS
  Filled 2017-03-21: qty 2

## 2017-03-21 MED ORDER — DIPHENHYDRAMINE HCL 50 MG/ML IJ SOLN
25.0000 mg | Freq: Once | INTRAMUSCULAR | Status: AC
  Administered 2017-03-21: 25 mg via INTRAVENOUS
  Filled 2017-03-21: qty 1

## 2017-03-21 NOTE — ED Notes (Signed)
Patient verbalizes understanding of discharge instructions, prescriptions, home care and follow up care. Patient out of department at this time with parent

## 2017-03-21 NOTE — ED Provider Notes (Signed)
[facility]-EMERGENCY DEPT Provider Note   CSN: [id number] Arrival date & time: 03/21/17  1507     History   Chief Complaint Chief Complaint  Patient presents with  . Headache  . Back Pain    HPI Jenny Schwartz is a 21 y.o. female.  Patient states that she had an LP couple days ago and has had a headache ever since.   The history is provided by the patient.  Headache   This is a recurrent problem. The current episode started 2 days ago. The problem occurs constantly. The problem has not changed since onset.The headache is associated with nothing. The pain is located in the occipital region. The quality of the pain is described as sharp. The pain is at a severity of 7/10.  Back Pain   Associated symptoms include headaches. Pertinent negatives include no chest pain and no abdominal pain.    History reviewed. No pertinent past medical history.  There are no active problems to display for this patient.   History reviewed. No pertinent surgical history.  OB History    No data available       Home Medications    Prior to Admission medications   Medication Sig Start Date End Date Taking? Authorizing Provider  ibuprofen (ADVIL,MOTRIN) 200 MG tablet Take 400 mg by mouth every 6 (six) hours as needed for headache or mild pain.   Yes [provider]  cephALEXin (KEFLEX) 500 MG capsule Take 1 capsule (500 mg total) by mouth 2 (two) times daily. 03/20/17 03/27/17  Pricilla Loveless, MD  Olopatadine HCl 0.2 % SOLN 2 gtts in affected eye tid prn for itchiness. Patient not taking: Reported on 03/19/2017 02/27/12   Moreno-Coll, Adlih, MD  ondansetron (ZOFRAN ODT) 4 MG disintegrating tablet 4mg  ODT q4 hours prn nausea/vomit 03/21/17   Bethann Berkshire, MD  traMADol (ULTRAM) 50 MG tablet Take 1 tablet (50 mg total) by mouth every 6 (six) hours as needed. 03/21/17   Bethann Berkshire, MD    Family History History reviewed. No pertinent family history.  Social History Social History  Substance  Use Topics  . Smoking status: Current Some Day Smoker    Types: Cigars  . Smokeless tobacco: Never Used  . Alcohol use No     Allergies   Patient has no known allergies.   Review of Systems Review of Systems  Constitutional: Negative for appetite change and fatigue.  HENT: Negative for congestion, ear discharge and sinus pressure.   Eyes: Negative for discharge.  Respiratory: Negative for cough.   Cardiovascular: Negative for chest pain.  Gastrointestinal: Negative for abdominal pain and diarrhea.  Genitourinary: Negative for frequency and hematuria.  Musculoskeletal: Positive for back pain.  Skin: Negative for rash.  Neurological: Positive for headaches. Negative for seizures.  Psychiatric/Behavioral: Negative for hallucinations.     Physical Exam Updated Vital Signs BP 128/68 (BP Location: Right Arm)   Pulse 91   Temp 98.3 F (36.8 C) (Oral)   Resp 16   Wt 185 lb (83.9 kg)   LMP 03/05/2017 (Approximate) Comment: negative beta HCG 03/19/17  SpO2 99%   BMI 25.80 kg/m   Physical Exam  Constitutional: She is oriented to person, place, and time. She appears well-developed.  HENT:  Head: Normocephalic.  Eyes: Conjunctivae and EOM are normal. No scleral icterus.  Neck: Neck supple. No thyromegaly present.  Cardiovascular: Normal rate and regular rhythm.  Exam reveals no gallop and no friction rub.   No murmur heard. Pulmonary/Chest: No stridor. She  has no wheezes. She has no rales. She exhibits no tenderness.  Abdominal: She exhibits no distension. There is no tenderness. There is no rebound.  Musculoskeletal: Normal range of motion. She exhibits no edema.  Lymphadenopathy:    She has no cervical adenopathy.  Neurological: She is oriented to person, place, and time. She exhibits normal muscle tone. Coordination normal.  Skin: No rash noted. No erythema.  Psychiatric: She has a normal mood and affect. Her behavior is normal.     ED Treatments / Results  Labs (all  labs ordered are listed, but only abnormal results are displayed) Labs Reviewed  CBG MONITORING, ED    EKG  EKG Interpretation None       Radiology Ct Soft Tissue Neck W Contrast  Result Date: 03/20/2017 CLINICAL DATA:  21 y/o F; left anterior neck pain, laryngitis, nausea, vomiting, headaches. EXAM: CT NECK WITH CONTRAST TECHNIQUE: Multidetector CT imaging of the neck was performed using the standard protocol following the bolus administration of intravenous contrast. CONTRAST:  75mL ISOVUE-300 IOPAMIDOL (ISOVUE-300) INJECTION 61% COMPARISON:  None. FINDINGS: Pharynx and larynx: Palatine and adenoid tonsil enlargement and pharyngeal mucosal thickening. No abscess. Salivary glands: No inflammation, mass, or stone. Thyroid: Normal. Lymph nodes: Mild prominence of cervical lymph nodes, probably reactive. No necrotic change. Vascular: Negative. Limited intracranial: Negative. Visualized orbits: Negative. Mastoids and visualized paranasal sinuses: Mild maxillary sinus mucosal thickening. Visualized paranasal sinuses and mastoid air cells are otherwise normally aerated. Skeleton: No acute or aggressive process. Upper chest: Negative. Other: None. IMPRESSION: Palatine and adenoid tonsillar enlargement with pharyngeal mucosal thickening compatible with pharyngitis. Prominent upper cervical lymph nodes are likely reactive. No abscess. Electronically Signed   By: Mitzi Hansen M.D.   On: 03/20/2017 00:56   Dg Lumbar Puncture Fluoro Guide  Result Date: 03/20/2017 CLINICAL DATA:  Headache, neck pain, and fever. EXAM: DIAGNOSTIC LUMBAR PUNCTURE UNDER FLUOROSCOPIC GUIDANCE FLUOROSCOPY TIME:  Fluoroscopy Time:  2 minutes 6 seconds Radiation Exposure Index (if provided by the fluoroscopic device): 25.3 mGy Number of Acquired Spot Images: 0 PROCEDURE: Informed consent was obtained from the patient prior to the procedure, including potential complications of headache, allergy, and pain. With the patient  prone, the lower back was prepped with Betadine. 1% Lidocaine was used for local anesthesia. Lumbar puncture was performed at the L2-3 level using a 20 gauge needle with return of clear CSF with an opening pressure of 18 cm water. 12 ml of CSF were obtained for laboratory studies. The needle was subsequent removed, and a sterile bandage applied to the skin puncture site. The patient tolerated the procedure well and there were no apparent complications. IMPRESSION: Successful diagnostic lumbar puncture at L2-3 under fluoroscopic guidance. No evidence of immediate complication. Electronically Signed   By: Myles Rosenthal M.D.   On: 03/20/2017 13:04    Procedures Procedures (including critical care time)  Medications Ordered in ED Medications  sodium chloride 0.9 % bolus 2,000 mL (2,000 mLs Intravenous New Bag/Given 03/21/17 1708)  ketorolac (TORADOL) 30 MG/ML injection 30 mg (30 mg Intravenous Given 03/21/17 1708)  diphenhydrAMINE (BENADRYL) injection 25 mg (25 mg Intravenous Given 03/21/17 1708)  metoCLOPramide (REGLAN) injection 10 mg (10 mg Intravenous Given 03/21/17 1708)     Initial Impression / Assessment and Plan / ED Course  I have reviewed the triage vital signs and the nursing notes.  Pertinent labs & imaging results that were available during my care of the patient were reviewed by me and considered in my medical decision  making (see chart for details).     Patient with post LP headache. Patient improved with treatment in emergency department. She'll be discharged home with some Zofran AND FOLLOW-UP AS NEEDED  Final Clinical Impressions(s) / ED Diagnoses   Final diagnoses:  Bad headache    New Prescriptions New Prescriptions   ONDANSETRON (ZOFRAN ODT) 4 MG DISINTEGRATING TABLET    4mg  ODT q4 hours prn nausea/vomit   TRAMADOL (ULTRAM) 50 MG TABLET    Take 1 tablet (50 mg total) by mouth every 6 (six) hours as needed.     Bethann BerkshireZammit, Sammantha Mehlhaff, MD 03/21/17 2015

## 2017-03-21 NOTE — Discharge Instructions (Signed)
Treatment plenty of fluids. Follow-up with any problems

## 2017-03-21 NOTE — ED Triage Notes (Signed)
Pt had LP done 2 days ago. Since then, pt describes rt sided neck/shoulder and headache pain.  Pt states it is progressively getting worse.

## 2017-03-21 NOTE — ED Notes (Signed)
Patient ambulatory to and from restroom at this time. Steady gait, no deficits noted. No needs voiced.

## 2017-03-23 LAB — CSF CULTURE
CULTURE: NO GROWTH
GRAM STAIN: NONE SEEN
SPECIAL REQUESTS: NORMAL

## 2017-03-24 ENCOUNTER — Emergency Department (HOSPITAL_COMMUNITY)
Admission: EM | Admit: 2017-03-24 | Discharge: 2017-03-24 | Disposition: A | Discharge status: Home / Self Care | Payer: Self-pay | Attending: Emergency Medicine | Admitting: Emergency Medicine

## 2017-03-24 ENCOUNTER — Encounter (HOSPITAL_COMMUNITY): Payer: Self-pay

## 2017-03-24 DIAGNOSIS — R51 Headache: Secondary | ICD-10-CM | POA: Insufficient documentation

## 2017-03-24 DIAGNOSIS — R519 Headache, unspecified: Secondary | ICD-10-CM

## 2017-03-24 DIAGNOSIS — R112 Nausea with vomiting, unspecified: Secondary | ICD-10-CM | POA: Insufficient documentation

## 2017-03-24 DIAGNOSIS — F17290 Nicotine dependence, other tobacco product, uncomplicated: Secondary | ICD-10-CM | POA: Insufficient documentation

## 2017-03-24 LAB — BASIC METABOLIC PANEL
ANION GAP: 11 (ref 5–15)
BUN: 8 mg/dL (ref 6–20)
CALCIUM: 9.1 mg/dL (ref 8.9–10.3)
CO2: 26 mmol/L (ref 22–32)
Chloride: 104 mmol/L (ref 101–111)
Creatinine, Ser: 0.76 mg/dL (ref 0.44–1.00)
GFR calc Af Amer: >60 mL/min (ref >60)
GFR calc non Af Amer: >60 mL/min (ref >60)
GLUCOSE: 86 mg/dL (ref 65–99)
POTASSIUM: 3 mmol/L — AB (ref 3.5–5.1)
Sodium: 141 mmol/L (ref 135–145)

## 2017-03-24 LAB — CBC WITH DIFFERENTIAL/PLATELET
Basophils Absolute: 0 10*3/uL (ref 0.0–0.1)
Basophils Relative: 0 %
Eosinophils Absolute: 0.1 10*3/uL (ref 0.0–0.7)
Eosinophils Relative: 0 %
HEMATOCRIT: 36.9 % (ref 36.0–46.0)
HEMOGLOBIN: 12.1 g/dL (ref 12.0–15.0)
LYMPHS PCT: 15 %
Lymphs Abs: 2 10*3/uL (ref 0.7–4.0)
MCH: 26.2 pg (ref 26.0–34.0)
MCHC: 32.8 g/dL (ref 30.0–36.0)
MCV: 79.9 fL (ref 78.0–100.0)
MONO ABS: 1 10*3/uL (ref 0.1–1.0)
MONOS PCT: 7 %
NEUTROS ABS: 10.4 10*3/uL — AB (ref 1.7–7.7)
Neutrophils Relative %: 78 %
Platelets: 447 10*3/uL — ABNORMAL HIGH (ref 150–400)
RBC: 4.62 MIL/uL (ref 3.87–5.11)
RDW: 14.8 % (ref 11.5–15.5)
WBC: 13.5 10*3/uL — ABNORMAL HIGH (ref 4.0–10.5)

## 2017-03-24 MED ORDER — SODIUM CHLORIDE 0.9 % IV BOLUS (SEPSIS)
1000.0000 mL | Freq: Once | INTRAVENOUS | Status: AC
  Administered 2017-03-24: 1000 mL via INTRAVENOUS

## 2017-03-24 MED ORDER — LACTATED RINGERS IV BOLUS (SEPSIS)
1000.0000 mL | Freq: Once | INTRAVENOUS | Status: AC
  Administered 2017-03-24: 1000 mL via INTRAVENOUS

## 2017-03-24 MED ORDER — KETOROLAC TROMETHAMINE 30 MG/ML IJ SOLN
15.0000 mg | Freq: Once | INTRAMUSCULAR | Status: AC
  Administered 2017-03-24: 15 mg via INTRAVENOUS
  Filled 2017-03-24: qty 1

## 2017-03-24 MED ORDER — DEXAMETHASONE SODIUM PHOSPHATE 10 MG/ML IJ SOLN
10.0000 mg | Freq: Once | INTRAMUSCULAR | Status: AC
  Administered 2017-03-24: 10 mg via INTRAVENOUS
  Filled 2017-03-24: qty 1

## 2017-03-24 MED ORDER — DIPHENHYDRAMINE HCL 50 MG/ML IJ SOLN
25.0000 mg | Freq: Once | INTRAMUSCULAR | Status: AC
  Administered 2017-03-24: 25 mg via INTRAVENOUS
  Filled 2017-03-24: qty 1

## 2017-03-24 MED ORDER — NAPROXEN 250 MG PO TABS: 250.0000 mg | ORAL_TABLET | Freq: Two times a day (BID) | ORAL | 0 refills | Status: DC

## 2017-03-24 MED ORDER — METOCLOPRAMIDE HCL 5 MG/ML IJ SOLN
10.0000 mg | Freq: Once | INTRAMUSCULAR | Status: AC
  Administered 2017-03-24: 10 mg via INTRAVENOUS
  Filled 2017-03-24: qty 2

## 2017-03-24 NOTE — ED Provider Notes (Signed)
[facility]-EMERGENCY DEPT Provider Note   CSN: [id number] Arrival date & time: 03/24/17  0701     History   Chief Complaint Chief Complaint  Patient presents with  . Headache  . Emesis    HPI Jenny Schwartz is a 21 y.o. female.  Jenny Schwartz is a 21 y.o. Female who presents to the emergency department complaining of a headache and nausea and vomiting ongoing for about 2 weeks now. Patient was seen in the emergency department about a week ago for a headache, subjective fevers and neck stiffness. She had no fever in the emergency department. At that time she had CT of the soft tissue of her neck to rule out a peritonsillar abscess due to sore throat. She also had a lumbar puncture to rule out meningitis. There is no evidence of meningitis. Patient reports since the lumbar puncture she is had positional headache. She reports it's associated with nausea and she has vomited today. She reports drinking water but vomiting up most of food she is eating. She complains of a frontal headache that worsens with standing up. She reports feeling lightheaded with position change. She denies head injury or trauma. She denies fevers, neck pain, neck stiffness, numbness, tingling, weakness, blurry vision, urinary symptoms, abdominal pain, hematemesis, diarrhea or rashes.   The history is provided by the patient and medical records. No language interpreter was used.  Headache   Associated symptoms include nausea and vomiting. Pertinent negatives include no fever and no shortness of breath.  Emesis   Associated symptoms include headaches. Pertinent negatives include no abdominal pain, no chills, no cough, no diarrhea and no fever.    History reviewed. No pertinent past medical history.  There are no active problems to display for this patient.   History reviewed. No pertinent surgical history.  OB History    No data available       Home Medications    Prior to Admission medications   Medication Sig  Start Date End Date Taking? Authorizing Provider  ondansetron (ZOFRAN ODT) 4 MG disintegrating tablet 4mg  ODT q4 hours prn nausea/vomit 03/21/17  Yes Bethann Berkshire, MD  traMADol (ULTRAM) 50 MG tablet Take 1 tablet (50 mg total) by mouth every 6 (six) hours as needed. Patient taking differently: Take 50 mg by mouth every 6 (six) hours as needed for moderate pain.  03/21/17  Yes Bethann Berkshire, MD  cephALEXin (KEFLEX) 500 MG capsule Take 1 capsule (500 mg total) by mouth 2 (two) times daily. Patient not taking: Reported on 03/24/2017 03/20/17 03/27/17  Pricilla Loveless, MD  naproxen (NAPROSYN) 250 MG tablet Take 1 tablet (250 mg total) by mouth 2 (two) times daily with a meal. 03/24/17   Everlene Farrier, PA-C  Olopatadine HCl 0.2 % SOLN 2 gtts in affected eye tid prn for itchiness. Patient not taking: Reported on 03/19/2017 02/27/12   Moreno-Coll, Christin Fudge, MD    Family History History reviewed. No pertinent family history.  Social History Social History  Substance Use Topics  . Smoking status: Current Some Day Smoker    Types: Cigars  . Smokeless tobacco: Never Used  . Alcohol use No     Allergies   Patient has no known allergies.   Review of Systems Review of Systems  Constitutional: Negative for chills and fever.  HENT: Negative for congestion and sore throat.   Eyes: Positive for photophobia. Negative for visual disturbance.  Respiratory: Negative for cough and shortness of breath.   Cardiovascular: Negative for chest pain.  Gastrointestinal:  Positive for nausea and vomiting. Negative for abdominal pain, blood in stool and diarrhea.  Genitourinary: Negative for difficulty urinating and dysuria.  Musculoskeletal: Negative for back pain, neck pain and neck stiffness.  Skin: Negative for rash.  Neurological: Positive for light-headedness and headaches. Negative for syncope, speech difficulty, weakness and numbness.     Physical Exam Updated Vital Signs BP 139/66 (BP Location: Left Arm)    Pulse 74   Temp 98.7 F (37.1 C) (Oral)   Resp 14   Ht 5\' 10"  (1.778 m)   Wt 83.9 kg   LMP 03/05/2017 (Approximate) Comment: negative beta HCG 03/19/17  SpO2 100%   BMI 26.54 kg/m   Physical Exam  Constitutional: She is oriented to person, place, and time. She appears well-developed and well-nourished. No distress.  Nontoxic appearing.  HENT:  Head: Normocephalic and atraumatic.  Right Ear: External ear normal.  Left Ear: External ear normal.  Mouth/Throat: Oropharynx is clear and moist.  No temporal edema or tenderness.  Eyes: Conjunctivae and EOM are normal. Pupils are equal, round, and reactive to light. Right eye exhibits no discharge. Left eye exhibits no discharge.  Neck: Normal range of motion. Neck supple. No JVD present. No tracheal deviation present.  No meningeal signs.  Cardiovascular: Normal rate, regular rhythm, normal heart sounds and intact distal pulses.  Exam reveals no gallop and no friction rub.   No murmur heard. Pulmonary/Chest: Effort normal and breath sounds normal. No stridor. No respiratory distress. She has no wheezes. She has no rales.  Abdominal: Soft. There is no tenderness. There is no guarding.  Abdomen is soft and nontender to palpation.  Musculoskeletal: She exhibits no edema.  Lymphadenopathy:    She has no cervical adenopathy.  Neurological: She is alert and oriented to person, place, and time. No cranial nerve deficit or sensory deficit. She exhibits normal muscle tone. Coordination normal.  Patient is alert and oriented 3. Cranial nerves are intact. Speech is clear and coherent. Finger-to-nose is intact. No meningeal signs. Sensation is intact in her bilateral lower extremities. Vision is grossly intact. Normal gait.   Skin: Skin is warm and dry. Capillary refill takes less than 2 seconds. No rash noted. She is not diaphoretic. No erythema. No pallor.  Psychiatric: She has a normal mood and affect. Her behavior is normal.  Nursing note and  vitals reviewed.    ED Treatments / Results  Labs (all labs ordered are listed, but only abnormal results are displayed) Labs Reviewed  BASIC METABOLIC PANEL - Abnormal; Notable for the following:       Result Value   Potassium 3.0 (*)    All other components within normal limits  CBC WITH DIFFERENTIAL/PLATELET - Abnormal; Notable for the following:    WBC 13.5 (*)    Platelets 447 (*)    Neutro Abs 10.4 (*)    All other components within normal limits    EKG  EKG Interpretation None       Radiology No results found.  Procedures Procedures (including critical care time)  Medications Ordered in ED Medications  sodium chloride 0.9 % bolus 1,000 mL (0 mLs Intravenous Stopped 03/24/17 1246)  metoCLOPramide (REGLAN) injection 10 mg (10 mg Intravenous Given 03/24/17 1023)  diphenhydrAMINE (BENADRYL) injection 25 mg (25 mg Intravenous Given 03/24/17 1023)  dexamethasone (DECADRON) injection 10 mg (10 mg Intravenous Given 03/24/17 1024)  ketorolac (TORADOL) 30 MG/ML injection 15 mg (15 mg Intravenous Given 03/24/17 1247)  lactated ringers bolus 1,000 mL (0 mLs  Intravenous Stopped 03/24/17 1437)     Initial Impression / Assessment and Plan / ED Course  I have reviewed the triage vital signs and the nursing notes.  Pertinent labs & imaging results that were available during my care of the patient were reviewed by me and considered in my medical decision making (see chart for details).  Clinical Course as of Mar 24 1437  Sun Mar 24, 2017  1215 Patient reports feeling much better. Pain down to a 5/10. She is tolerating water without vomiting. She still would like more pain medication and wants to try something to eat. Will do toradol and crackers.   [WD]    Clinical Course User Index [WD] Everlene Farrier, PA-C   This is a 21 y.o. Female who presents to the emergency department complaining of a headache and nausea and vomiting ongoing for about 2 weeks now. Patient was seen in the  emergency department about a week ago for a headache, subjective fevers and neck stiffness. She had no fever in the emergency department. At that time she had CT of the soft tissue of her neck to rule out a peritonsillar abscess due to sore throat. She also had a lumbar puncture to rule out meningitis. There is no evidence of meningitis. Patient reports since the lumbar puncture she is had positional headache. She reports it's associated with nausea and she has vomited today. She reports drinking water but vomiting up most of food she is eating. She complains of a frontal headache that worsens with standing up. She reports feeling lightheaded with position change. She denies head injury or trauma. She denies fevers, neck pain.  On exam the patient is afebrile nontoxic appearing. She has no focal neurological deficits. No meningeal signs. Suspect post lumbar puncture headache.  BMP is unremarkable. CBC is remarkable only for white count of 13,000. Suspect this is related to her vomiting. She is afebrile nontoxic appearing. Patient responded very well to fluids and migraine cocktail. She is tolerating by mouth and reevaluation. She is eating crackers and drinking water. She tells me she is feeling much better. She ambulates without position change and tells me prior to discharge her headache has completely resolved. I suspect the patient became dehydrated after vomiting due to her headache after this lumbar puncture. She now has her symptoms resolved and feels ready for discharge. We'll discharge at this time with strict and specific return precautions. I advised the patient to follow-up with their primary care provider this week. I advised the patient to return to the emergency department with new or worsening symptoms or new concerns. The patient verbalized understanding and agreement with plan.     Final Clinical Impressions(s) / ED Diagnoses   Final diagnoses:  Bad headache  Non-intractable vomiting with  nausea, unspecified vomiting type    New Prescriptions New Prescriptions   NAPROXEN (NAPROSYN) 250 MG TABLET    Take 1 tablet (250 mg total) by mouth 2 (two) times daily with a meal.     Everlene Farrier, PA-C 03/24/17 1444    Jerelyn Scott, MD 03/24/17 1457

## 2017-03-24 NOTE — ED Notes (Signed)
TOLERATING PO FLUIDS AND CRACKERS.

## 2017-03-24 NOTE — ED Triage Notes (Signed)
Pt c/o headache and emesis x 2 weeks and dizziness w/ standing x 1 day.  Pain score 10/10.  Pt has been seen several times recently for same.  Pt has not followed up w/ Neurology referral.  Sts she took Tramadol w/o relief.

## 2017-03-24 NOTE — ED Notes (Signed)
EDPA Provider at bedside. 

## 2017-03-24 NOTE — ED Notes (Signed)
Pt seen and evaluated for presenting complaint Thursday. Pt given RX nausea and pt has been taking without relief. Here for reevaluation. Pt currently states she has been vomiting. Emesis clear and water thin

## 2017-03-24 NOTE — ED Notes (Signed)
While trying to take Patient to room, she began coughing and spitting into emesis bag. No vomiting or dry heaving noted.  Sts "my face is zinging."  Also, when asked about following up w/ Neurology referral, Pt asked "what is a neurologist?" Pt could benefit from additional education.

## 2017-05-10 ENCOUNTER — Ambulatory Visit
Admit: 2017-05-10 | Discharge: 2017-05-10 | Payer: MEDICAID | Attending: Family Medicine | Primary: Student in an Organized Health Care Education/Training Program

## 2017-05-10 DIAGNOSIS — Z3042 Encounter for surveillance of injectable contraceptive: Secondary | ICD-10-CM

## 2017-05-10 LAB — POCT URINE PREGNANCY: Preg Test, Ur: NEGATIVE

## 2017-05-10 MED ORDER — MEDROXYPROGESTERONE ACETATE 150 MG/ML IM SUSP
150 | INTRAMUSCULAR | Status: AC
Start: 2017-05-10 — End: 2017-05-10

## 2017-05-10 MED ORDER — MEDROXYPROGESTERONE ACETATE 150 MG/ML IM SUSP
150 MG/ML | INTRAMUSCULAR | Status: DC
Start: 2017-05-10 — End: 2017-08-09
  Administered 2017-05-10: 14:00:00 150 mg via INTRAMUSCULAR

## 2017-05-10 MED FILL — MEDROXYPROGESTERONE ACETATE 150 MG/ML IM SUSP: 150 MG/ML | INTRAMUSCULAR | Qty: 1

## 2017-05-10 NOTE — Progress Notes (Signed)
[facility]  [address]  [address] [place] [address]  Dept: [phone]  Dept Fax: [phone]   Subjective   Lynn Welch is a 21 y.o. year old who presents to the office with the following complaint(s):    HPI:     Here for depo  -  Last one in November  -  Likes the depo  -   Last 3 months ago  -  Just moved from [place] , that is why she missed her last one  -   On depo for 2 years   -  Not sexually active for last 3 months  -  Does not want to have more children    Review of Systems   Constitutional: Negative for activity change, appetite change, chills, diaphoresis, fatigue, fever and unexpected weight change.   Respiratory: Negative for shortness of breath.    Cardiovascular: Negative for chest pain, palpitations and leg swelling.   Gastrointestinal: Negative for abdominal pain.   Genitourinary: Negative for dysuria, menstrual problem, pelvic pain, vaginal bleeding and vaginal discharge.   Neurological: Negative for headaches.        No Known Allergies  Current Outpatient Prescriptions on File Prior to Visit   Medication Sig Dispense Refill   ??? ibuprofen (ADVIL;MOTRIN) 600 MG tablet Take 1 tablet by mouth every 6 hours as needed for Pain 20 tablet 0   ??? ferrous sulfate (FE TABS) 325 (65 Fe) MG EC tablet Take 1 tablet by mouth 2 times daily 90 tablet 3   ??? Prenatal Vit-Fe Fumarate-FA (PRENATAL VITAMINS) 28-0.8 MG TABS Take 1 tablet by mouth daily SUBSTITUTE GENERIC PRENATAL VITAMIN PER PT'S INSURANCE. 30 tablet 11   ??? propranolol (INDERAL LA) 80 MG extended release capsule Take 1 capsule by mouth daily 30 capsule 2   ??? SUMAtriptan (IMITREX) 25 MG tablet Take 1 tablet by mouth once as needed for Migraine (may repeat in 2 hours if headache not improved) 9 tablet 1   ??? ibuprofen (ADVIL;MOTRIN) 600 MG tablet Take 1 tablet by mouth every 6 hours as needed for Pain 120 tablet 3     No current facility-administered medications on file prior to visit.       Patient Active Problem  List   Diagnosis   ??? Polydactyly of toes   ??? Vitamin D deficiency   ??? Iron deficiency anemia   ??? Migraine with aura and without status migrainosus, not intractable   ??? Hx of trichomoniasis   ??? Hx of major depression      Social History   Substance Use Topics   ??? Smoking status: Never Smoker   ??? Smokeless tobacco: Never Used   ??? Alcohol use No      Objective   BP 120/75 (Site: Left Arm, Position: Sitting, Cuff Size: Medium Adult)    Pulse 80    Temp 97.2 ??F (36.2 ??C) (Temporal)    Resp 18    Ht 5\' 9"  (1.753 m)    Wt 232 lb (105.2 kg)    LMP 05/06/2017    BMI 34.26 kg/m??   Physical Exam   Constitutional: She is oriented to person, place, and time. She appears well-developed and well-nourished. No distress.   Neck: Neck supple. No thyromegaly present.   Cardiovascular: Normal rate, regular rhythm, normal heart sounds and intact distal pulses.  Exam reveals no gallop and no friction rub.    No murmur heard.  Pulmonary/Chest: Effort normal and breath  sounds normal. No respiratory distress. She has no wheezes. She has no rales.   Abdominal: Soft. Bowel sounds are normal. She exhibits no distension. There is no tenderness. There is no rebound and no guarding.   Musculoskeletal: She exhibits no edema.   Lymphadenopathy:     She has no cervical adenopathy.   Neurological: She is alert and oriented to person, place, and time.   Skin: Skin is warm and dry. She is not diaphoretic.   Psychiatric: She has a normal mood and affect.       Assessment/Plan   1. Encounter for Depo-Provera contraception  - medroxyPROGESTERone (DEPO-PROVERA) injection 150 mg; Inject 1 mL into the muscle every 3 months  - POCT urine pregnancy     Discussed switching to nexplanon.  She will consider it.  Discussed risks and benefits of both.  Not sexually active in last 3 months.  Preg test neg.   Return for urine check in 2 weeks.      Follow-up: Return in about 3 months (around 08/10/2017) for RN visit,  Ceferino Lang 2-3 months if decides for  nexplanon.    Zollie ScaleLindsay Alie Hardgrove, MD  05/10/17  10:12 AM

## 2017-05-10 NOTE — Progress Notes (Signed)
Teaching Physician Note: Indirect Supervision - Modifier GE  During or immediately after this visit, I discussed this case with the treating resident.   Our discussion included the history obtained by the resident, the resident's exam findings, and the resident's treatment plan.    Please see resident???s note for further details.    This service has been performed in part by a resident under the direction of a teaching physician (GE Modifier)    Taaliyah Delpriore Denny Sansa Alkema, MD

## 2017-05-10 NOTE — Other (Unsigned)
Patient Acct Nbr: [id number]   Primary AUTH/CERT:   Primary Insurance Company Name: [employer]  Primary Insurance Plan name: [facility]  Primary Insurance Group Number:   Primary Insurance Plan Type: Health  Primary Insurance Policy Number: [id number]

## 2017-05-10 NOTE — Progress Notes (Signed)
Previous visit notes and plan of care reviewed: YES  Verified patient is taking prescribed medications: Yes  PHQ-2/PHQ-9 screening done: Yes  Falls risk assessment done: No  Assess immunization status for current needs: Yes  Preventative patient care summary generated: No  Care opportunities discussed: No  MyChart activation discussed: Yes      Patientwas able to ambulate safely to the examination room.

## 2017-05-10 NOTE — Progress Notes (Signed)
Medroxyprogesterone acetate 150mg given per order. Pt. Tolerated procedure well.

## 2017-05-17 ENCOUNTER — Encounter: Primary: Student in an Organized Health Care Education/Training Program

## 2017-06-17 ENCOUNTER — Encounter
Attending: Student in an Organized Health Care Education/Training Program | Primary: Student in an Organized Health Care Education/Training Program

## 2017-08-09 ENCOUNTER — Ambulatory Visit
Admit: 2017-08-09 | Discharge: 2017-08-09 | Payer: MEDICAID | Attending: Student in an Organized Health Care Education/Training Program | Primary: Student in an Organized Health Care Education/Training Program

## 2017-08-09 DIAGNOSIS — Z3042 Encounter for surveillance of injectable contraceptive: Secondary | ICD-10-CM

## 2017-08-09 LAB — POCT URINE PREGNANCY: Preg Test, Ur: NEGATIVE

## 2017-08-09 MED ORDER — MEDROXYPROGESTERONE ACETATE 150 MG/ML IM SUSP
150 | INTRAMUSCULAR | Status: AC
Start: 2017-08-09 — End: 2017-08-09

## 2017-08-09 MED ORDER — MEDROXYPROGESTERONE ACETATE 150 MG/ML IM SUSP
150 MG/ML | INTRAMUSCULAR | Status: AC
Start: 2017-08-09 — End: 2018-04-08
  Administered 2017-08-09 – 2018-04-08 (×4): 150 mg via INTRAMUSCULAR

## 2017-08-09 MED ORDER — MEDROXYPROGESTERONE ACETATE 150 MG/ML IM SUSP
150 MG/ML | INTRAMUSCULAR | Status: DC
Start: 2017-08-09 — End: 2017-08-09

## 2017-08-09 NOTE — Progress Notes (Signed)
Medroxyprogesterone acetate 150mg given per order. Pt. Tolerated procedure well.

## 2017-08-09 NOTE — Progress Notes (Signed)
Teaching Physician Note: Indirect Supervision - Modifier GE  During or immediately after this visit, I discussed this case with the treating resident.   Our discussion included the history obtained by the resident, the resident's exam findings, and the resident's treatment plan.    Please see resident's note for further details.    This service has been performed in part by a resident under the direction of a teaching physician (GE Modifier)    Shara Hartis M Shlomie Romig, MD

## 2017-08-09 NOTE — Progress Notes (Signed)
[facility]  [address]  [address] [place] [address]  Dept: [phone]  Dept Fax: [phone]   Subjective   LILYONA RICHNER is a 21 y.o. year old who presents to the office with the following complaint(s):    HPI: Depo shot  - Has been on depo for past two years.   - Last shot 05/10/17  - Last visit discussed possibly switching to nexplanon   - Not interested in changing methods  - LMP 07/27/17   - Tolerating it well. No concerns  - Does not desire another child at this time.       Review of Systems   Constitutional: Negative for chills and fever.   Eyes: Negative for visual disturbance.   Gastrointestinal: Negative for constipation, diarrhea, nausea and vomiting.   Genitourinary: Negative for menstrual problem.   Skin: Negative for rash.   Neurological: Negative for headaches.        No Known Allergies  Current Outpatient Prescriptions on File Prior to Visit   Medication Sig Dispense Refill   . ibuprofen (ADVIL;MOTRIN) 600 MG tablet Take 1 tablet by mouth every 6 hours as needed for Pain 20 tablet 0   . ferrous sulfate (FE TABS) 325 (65 Fe) MG EC tablet Take 1 tablet by mouth 2 times daily 90 tablet 3   . Prenatal Vit-Fe Fumarate-FA (PRENATAL VITAMINS) 28-0.8 MG TABS Take 1 tablet by mouth daily SUBSTITUTE GENERIC PRENATAL VITAMIN PER PT'S INSURANCE. 30 tablet 11   . propranolol (INDERAL LA) 80 MG extended release capsule Take 1 capsule by mouth daily 30 capsule 2   . SUMAtriptan (IMITREX) 25 MG tablet Take 1 tablet by mouth once as needed for Migraine (may repeat in 2 hours if headache not improved) 9 tablet 1   . ibuprofen (ADVIL;MOTRIN) 600 MG tablet Take 1 tablet by mouth every 6 hours as needed for Pain 120 tablet 3     No current facility-administered medications on file prior to visit.       Patient Active Problem List   Diagnosis   . Polydactyly of toes   . Vitamin D deficiency   . Iron deficiency anemia   . Migraine with aura and without status migrainosus, not intractable   .  Hx of trichomoniasis   . Hx of major depression      Social History   Substance Use Topics   . Smoking status: Never Smoker   . Smokeless tobacco: Never Used   . Alcohol use No      Objective   BP 111/68 (Site: Right Upper Arm, Position: Sitting, Cuff Size: Large Adult)   Pulse 101   Temp 97.4 F (36.3 C) (Temporal)   Resp 16   Ht 5' 9.5" (1.765 m)   Wt 228 lb 12.8 oz (103.8 kg)   LMP 07/15/2017 (Within Weeks)   BMI 33.30 kg/m   Physical Exam   Constitutional: She is oriented to person, place, and time. She appears well-developed and well-nourished. No distress.   HENT:   Head: Normocephalic and atraumatic.   Eyes: Conjunctivae and EOM are normal.   Neck: Normal range of motion. Neck supple.   Cardiovascular: Normal rate, regular rhythm, normal heart sounds and intact distal pulses.  Exam reveals no gallop and no friction rub.    No murmur heard.  Pulmonary/Chest: Effort normal and breath sounds normal. No respiratory distress. She has no wheezes. She has no rales. She exhibits no tenderness.   Neurological:  She is alert and oriented to person, place, and time.   Psychiatric: She has a normal mood and affect.   Vitals reviewed.      Assessment/Plan   1. Encounter for Depo-Provera contraception  - POCT urine pregnancy  - medroxyPROGESTERone (DEPO-PROVERA) injection 150 mg; Inject 1 mL into the muscle every 3 months     Lab Results   Component Value Date    PREGTESTUR neg 08/09/2017       Patient's last menstrual period was 07/15/2017 (within weeks).      Follow-up: Return in about 3 months (around 11/08/2017) for RN visit, depo.    Aniceto Boss, MD  08/09/17  2:05 PM

## 2017-08-09 NOTE — Other (Unsigned)
Patient Acct Nbr: [id number]   Primary AUTH/CERT:   Primary Insurance Company Name: [employer]  Primary [facility] Plan [facility]: [facility]  Primary Insurance Group Number:   Primary Insurance Plan Type: Health  Primary Insurance Policy Number: [id number]

## 2017-08-09 NOTE — Progress Notes (Signed)
Previous visit notes and plan of care reviewed: YES  Verified patient is taking prescribed medications: Yes  PHQ-2/PHQ-9 screening done: Yes  Falls risk assessment done: No  Assess immunization status for current needs: Yes  Preventative patient care summary generated: Yes  Care opportunities discussed: Yes  MyChart activation discussed: No    Patientwas able to ambulate safely to the examination room.     Provider was not notified of possible fall risk    Patient (or parent/guardian) refuses influenza immunization at this time.

## 2017-10-28 ENCOUNTER — Encounter
Admit: 2017-10-28 | Discharge: 2017-10-28 | Payer: MEDICAID | Primary: Student in an Organized Health Care Education/Training Program

## 2017-10-28 DIAGNOSIS — Z3042 Encounter for surveillance of injectable contraceptive: Secondary | ICD-10-CM

## 2017-10-28 MED ORDER — MEDROXYPROGESTERONE ACETATE 150 MG/ML IM SUSP
150 | INTRAMUSCULAR | Status: AC
Start: 2017-10-28 — End: 2017-10-28

## 2017-10-28 MED FILL — MEDROXYPROGESTERONE ACETATE 150 MG/ML IM SUSP: 150 mg/mL | INTRAMUSCULAR | Qty: 1

## 2017-10-28 NOTE — Other (Unsigned)
Patient Acct Nbr: [id number]   Primary AUTH/CERT:   Primary Insurance Company Name: [employer]  [facility]  Primary Insurance Group Number:   Primary Insurance Plan Type: Health  Primary Insurance Policy Number: [id number]

## 2017-10-28 NOTE — Progress Notes (Signed)
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    Patientwas able to ambulate safely to the examination room.     Provider was not notified of possible fall risk  Pt advised to increase Ca+ in diet thru supplements or diet. Next depo appointment scheduled 12 weeks from now.    Pt interested in changing to IUD or nexplanon. Scheduled appt with PCP to discuss.

## 2017-10-29 ENCOUNTER — Telehealth

## 2017-10-29 NOTE — Telephone Encounter (Signed)
Please refill ibuprofen,prenatals ( not pregnant).and iron.

## 2017-10-30 MED ORDER — IBUPROFEN 600 MG PO TABS
600 | ORAL_TABLET | Freq: Four times a day (QID) | ORAL | 1 refills | Status: AC | PRN
Start: 2017-10-30 — End: ?

## 2017-10-30 MED ORDER — PRENATAL VITAMINS 28-0.8 MG PO TABS
28-0.8 | ORAL_TABLET | Freq: Every day | ORAL | 11 refills | Status: AC
Start: 2017-10-30 — End: 2017-11-29

## 2017-10-30 NOTE — Telephone Encounter (Signed)
eRx sent.

## 2017-12-04 ENCOUNTER — Encounter
Attending: Student in an Organized Health Care Education/Training Program | Primary: Student in an Organized Health Care Education/Training Program

## 2017-12-16 ENCOUNTER — Encounter: Primary: Student in an Organized Health Care Education/Training Program

## 2018-01-13 ENCOUNTER — Encounter
Admit: 2018-01-13 | Discharge: 2018-01-13 | Payer: MEDICAID | Primary: Student in an Organized Health Care Education/Training Program

## 2018-01-13 DIAGNOSIS — Z3042 Encounter for surveillance of injectable contraceptive: Secondary | ICD-10-CM

## 2018-01-13 MED ORDER — MEDROXYPROGESTERONE ACETATE 150 MG/ML IM SUSP
150 | INTRAMUSCULAR | Status: AC
Start: 2018-01-13 — End: 2018-01-13

## 2018-01-13 MED FILL — MEDROXYPROGESTERONE ACETATE 150 MG/ML IM SUSP: 150 mg/mL | INTRAMUSCULAR | Qty: 1

## 2018-01-13 NOTE — Other (Unsigned)
Patient Acct Nbr: [id number]   Primary AUTH/CERT:   Primary Insurance Company Name: [employer]  Primary Insurance Plan name: [facility]  Primary Insurance Group Number:   Primary Insurance Plan Type: Health  Primary Insurance Policy Number: [id number]

## 2018-01-13 NOTE — Progress Notes (Signed)
Faculty back up: Dr Kennedy BuckerBabyak.  Here for RN visit depo-provera. Denies questions/concerns related to medication. Last depo given 10/28/17. Next depo scheduled for 04/08/18.  Patientwas able to ambulate safely to the examination room.     Provider was not notified of possible fall risk

## 2018-02-28 ENCOUNTER — Inpatient Hospital Stay: Admit: 2018-02-28 | Discharge: 2018-02-28 | Disposition: A | Discharge status: Home / Self Care

## 2018-02-28 DIAGNOSIS — L309 Dermatitis, unspecified: Secondary | ICD-10-CM

## 2018-02-28 MED ORDER — CEPHALEXIN 500 MG PO CAPS
500 MG | Freq: Once | ORAL | Status: AC
Start: 2018-02-28 — End: 2018-02-28
  Administered 2018-02-28: 13:00:00 500 mg via ORAL

## 2018-02-28 MED ORDER — FAMOTIDINE 20 MG PO TABS
20 MG | Freq: Once | ORAL | Status: AC
Start: 2018-02-28 — End: 2018-02-28
  Administered 2018-02-28: 13:00:00 20 mg via ORAL

## 2018-02-28 MED ORDER — DIPHENHYDRAMINE HCL 25 MG PO CAPS
25 MG | ORAL_CAPSULE | ORAL | 0 refills | Status: AC | PRN
Start: 2018-02-28 — End: 2018-03-10

## 2018-02-28 MED ORDER — SULFAMETHOXAZOLE-TRIMETHOPRIM 800-160 MG PO TABS
800-160 MG | Freq: Once | ORAL | Status: AC
Start: 2018-02-28 — End: 2018-02-28
  Administered 2018-02-28: 13:00:00 1 via ORAL

## 2018-02-28 MED ORDER — PREDNISONE 10 MG PO TABS
10 MG | ORAL_TABLET | ORAL | 0 refills | Status: AC
Start: 2018-02-28 — End: 2018-03-10

## 2018-02-28 MED ORDER — PREDNISONE 20 MG PO TABS
20 MG | Freq: Once | ORAL | Status: AC
Start: 2018-02-28 — End: 2018-02-28
  Administered 2018-02-28: 13:00:00 60 mg via ORAL

## 2018-02-28 MED ORDER — CEPHALEXIN 500 MG PO CAPS
500 MG | ORAL_CAPSULE | Freq: Four times a day (QID) | ORAL | 0 refills | Status: AC
Start: 2018-02-28 — End: 2018-03-10

## 2018-02-28 MED ORDER — SULFAMETHOXAZOLE-TRIMETHOPRIM 800-160 MG PO TABS
800-160 MG | ORAL_TABLET | Freq: Two times a day (BID) | ORAL | 0 refills | Status: AC
Start: 2018-02-28 — End: 2018-03-10

## 2018-02-28 MED ORDER — DIPHENHYDRAMINE HCL 25 MG PO TABS
25 MG | Freq: Once | ORAL | Status: AC
Start: 2018-02-28 — End: 2018-02-28
  Administered 2018-02-28: 13:00:00 50 mg via ORAL

## 2018-02-28 MED FILL — BANOPHEN 25 MG PO TABS: 25 mg | ORAL | Qty: 2

## 2018-02-28 MED FILL — PREDNISONE 20 MG PO TABS: 20 mg | ORAL | Qty: 3

## 2018-02-28 MED FILL — FAMOTIDINE 20 MG PO TABS: 20 mg | ORAL | Qty: 1

## 2018-02-28 MED FILL — SULFAMETHOXAZOLE-TRIMETHOPRIM 800-160 MG PO TABS: 800-160 mg | ORAL | Qty: 1

## 2018-02-28 MED FILL — CEPHALEXIN 500 MG PO CAPS: 500 mg | ORAL | Qty: 1

## 2018-02-28 NOTE — ED Provider Notes (Signed)
[facility] EMERGENCY DEPT  eMERGENCY dEPARTMENT eNCOUnter      Pt Name: Lynn Welch  MRN: [id number]  Birthdate 03/20/1996  Date of evaluation: 02/28/2018  Provider: Rollene Fare., APRN - CNP     This patient was evaluated within my scope of practice with an attending physician available in the department for consultation    CHIEF COMPLAINT       Chief Complaint   Patient presents with   . Rash     pt presents with complaints of having rash over her extremeties for approx 2 weeks.          HISTORY OF PRESENT ILLNESS   (Location/Symptom, Timing/Onset,Context/Setting, Quality, Duration, Modifying Factors, Severity) Note limiting factors.   HPI    Lynn Welch is a 22 y.o. female who presents to the emergency department with complaint of diffuse maculopapular rash that began 2 weeks ago. She states that it is extremely itchy and she is concerned that her son might get it. She denies any change in laundry soaps, detergents, or lotions. She denies chest pain, shortness of breath, nausea, vomiting, fever, chills, abdominal pain, change in bowel or bladder habits.     They describe the pain as itching and then burning after she scratches  The patient gives a pain scale of 7/10, with 10 being the worst pain.   This pain does not radiate.    This is worsened by scratching  This is improved by not scratching  This patient's PMH is significant for iron deficiency anemia. She denies any PSH  The patient is a non-smoker. She drinks alcohol socially. She denies illicit drug use.  I have reviewed the patient's personal and family past medical history as well as the nurse's notes and I agree. Personal history and family past medical history as listed in this chart.  I havereviewed the patient's vitals and agree.    REVIEW OF SYSTEMS    (2+ forlevel 4; 10+ for level 5)   Review of Systems   Constitutional: Negative for chills, fatigue and fever.   HENT: Negative for congestion, ear pain, rhinorrhea, sneezing and sore throat.    Eyes:  Negative for visual disturbance.   Respiratory: Negative for cough and shortness of breath.    Cardiovascular: Negative for chest pain and leg swelling.   Gastrointestinal: Negative for abdominal pain, nausea and vomiting.   Endocrine: Negative for cold intolerance and heat intolerance.   Genitourinary: Negative for difficulty urinating, dysuria and urgency.   Musculoskeletal: Negative for myalgias and neck pain.   Skin: Positive for rash. Negative for pallor.   Allergic/Immunologic: Negative for environmental allergies.   Neurological: Negative for dizziness, light-headedness and headaches.   Hematological: Does not bruise/bleed easily.   Psychiatric/Behavioral: Negative for confusion and sleep disturbance.     This patient's personal and family past medical history as stated in HPI and otherwise unremarkable.  ROS as stated in HPI otherwise unremarkable,a total of 10 systems reviewed.    PAST MEDICAL HISTORY     Past Medical History:   Diagnosis Date   . 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 Iron deficiency anemia 05/13/2015   . Polydactyly of toes     right foot, tied off as infant   . Pregnancy 09/21/2014   . Spontaneous vaginal delivery 05/11/2015   . UTI (urinary tract infection) during pregnancy     Treated with Keflex       SURGICAL HISTORY  History reviewed. No pertinent surgical history.    CURRENT MEDICATIONS       Discharge Medication List as of 02/28/2018  9:09 AM      CONTINUE these medications which have NOT CHANGED    Details   Prenatal Vit-Fe Fumarate-FA (PRENATAL VITAMINS) 28-0.8 MG TABS Take 1 tablet by mouth daily SUBSTITUTE GENERIC PRENATAL VITAMIN PER PT'S INSURANCE., Disp-30 tablet, R-11Normal      ibuprofen (ADVIL;MOTRIN) 600 MG tablet Take 1 tablet by mouth every 6 hours as needed for Pain, Disp-30 tablet, R-1Normal             ALLERGIES     Patient has no known allergies.    FAMILY HISTORY       Family History   Problem Relation Age of Onset   . Diabetes  Mother         SOCIAL HISTORY       Social History     Socioeconomic History   . Marital status: Single     Spouse name: None   . Number of children: None   . Years of education: None   . Highest education level: None   Occupational History   . None   Social Needs   . Financial resource strain: None   . Food insecurity:     Worry: None     Inability: None   . Transportation needs:     Medical: None     Non-medical: None   Tobacco Use   . Smoking status: Never Smoker   . Smokeless tobacco: Never Used   Substance and Sexual Activity   . Alcohol use: Yes     Comment: occasionally   . Drug use: No   . Sexual activity: Yes     Partners: Male   Lifestyle   . Physical activity:     Days per week: None     Minutes per session: None   . Stress: None   Relationships   . Social connections:     Talks on phone: None     Gets together: None     Attends religious service: None     Active member of club or organization: None     Attends meetings of clubs or organizations: None     Relationship status: None   . Intimate partner violence:     Fear of current or ex partner: None     Emotionally abused: None     Physically abused: None     Forced sexual activity: None   Other Topics Concern   . None   Social History Narrative   . None       SCREENINGS           PHYSICAL EXAM    (up to 7 for level 4, 8 or more for level 5)     ED Triage Vitals   BP Temp Temp Source Pulse Resp SpO2 Height Weight   02/28/18 0902 02/28/18 0858 02/28/18 0858 02/28/18 0902 02/28/18 0858 02/28/18 0858 02/28/18 0858 02/28/18 0858   122/80 98.4 F (36.9 C) Oral 99 16 100 % 5\' 9"  (1.753 m) 182 lb (82.6 kg)       Physical Exam   Constitutional: She is oriented to person, place, and time. She appears well-developed and well-nourished. No distress.   HENT:   Head: Normocephalic and atraumatic.   Mouth/Throat: Oropharynx is clear and moist. No oropharyngeal exudate.   Eyes: No scleral icterus.   Neck: Normal range of  motion.   Cardiovascular: Normal rate, regular  rhythm, normal heart sounds and intact distal pulses. Exam reveals no gallop and no friction rub.   No murmur heard.  Pulmonary/Chest: Effort normal and breath sounds normal. No stridor. No respiratory distress. She has no wheezes. She has no rales. She exhibits no tenderness.   Abdominal: Soft. There is no tenderness.   Musculoskeletal: Normal range of motion.   Lymphadenopathy:     She has no cervical adenopathy.   Neurological: She is alert and oriented to person, place, and time.   Skin: Skin is warm and dry. Capillary refill takes less than 2 seconds. Rash noted. She is not diaphoretic.   Diffuse maculopapular rash on her bilateral upper and lower extremities, and anterior and posterior torso. There are no vesicular lesions, no petechiae.   There is an area that is erythematous without fluctuance or induration patient states that she has been scratching denies. This is concerning for a cellulitis. Distal pulses are intact.   Psychiatric: She has a normal mood and affect. Her behavior is normal.   Nursing note and vitals reviewed.    LABS:  Labs Reviewed - No data to display     All other labs were within normal range or not returned as of this dictation.    EMERGENCY DEPARTMENT COURSE and DIFFERENTIAL DIAGNOSIS/MDM:   Vitals:    Vitals:    02/28/18 0858 02/28/18 0902 02/28/18 0908   BP:  122/80    Pulse:  99 84   Resp: 16     Temp: 98.4 F (36.9 C)     TempSrc: Oral     SpO2: 100%     Weight: 82.6 kg (182 lb)     Height: 5\' 9"  (1.753 m)         Medications   famotidine (PEPCID) tablet 20 mg (20 mg Oral Given 02/28/18 0916)   diphenhydrAMINE (BENADRYL) tablet 50 mg (50 mg Oral Given 02/28/18 0915)   predniSONE (DELTASONE) tablet 60 mg (60 mg Oral Given 02/28/18 0916)   sulfamethoxazole-trimethoprim (BACTRIM DS;SEPTRA DS) 800-160 MG per tablet 1 tablet (1 tablet Oral Given 02/28/18 0916)   cephALEXin (KEFLEX) capsule 500 mg (500 mg Oral Given 02/28/18 0915)       MDM  Her past medical history, past surgical history,  and HPI were obtained from the patient herself, nursing staff, and through chart review. She Will Be Given 50 Mg of Benadryl, 20 Mg of Pepcid, and 60 Mg of Prednisone in the Emergency Department.  Lab work was considered, however, I do not feel it necessary at this time. There is no concern for abscess or vasculitis.   She does not appear toxic or septic. She remained stable throughout her course in the emergency department. She will be discharged home in stable condition with prescriptions for prednisone, Benadryl, Bactrim, and Keflex. She was instructed to follow-up with her primary care physician. She was instructed to return to the emergency department for any new or worsening symptoms. All of her questions were answered to the best of my ability.    Patient complains of erythema, warmth, tenderness to skin. Patient has findings consistent with cellulitis. At this time there is no evidence of fluctuance or abscess, there is been no period and drainage, patient has not had any fevers. Plan: Antibiotics, pain control, followup in 24-48 hours for recheck.      REVAL:     0908 She reports improvement in symptoms      Comment: Please  note this report has been produced using speech recognition software and may contain errors related to that system including errors in grammar, punctuation, and spelling, as wellas words and phrases that may be inappropriate.  If there are any questions or concerns please feel free to contact the dictating provider for clarification.      PROCEDURES:  Unless otherwise noted below, none     Procedures    FINAL IMPRESSION      1. Dermatitis    2. Cellulitis, unspecified cellulitis site          DISPOSITION/PLAN   DISPOSITION Decision To Discharge 02/28/2018 09:09:00 AM      PATIENT REFERRED TO:  Elizabeth Sauer, DO  [address]  [place] [address]  [place] [address]  [phone]    Call   If symptoms worsen      DISCHARGE MEDICATIONS:  Discharge Medication List as of 02/28/2018  9:09 AM      START taking these  medications    Details   sulfamethoxazole-trimethoprim (BACTRIM DS) 800-160 MG per tablet Take 1 tablet by mouth 2 times daily for 10 days, Disp-20 tablet, R-0Print      cephALEXin (KEFLEX) 500 MG capsule Take 1 capsule by mouth 4 times daily for 10 days, Disp-40 capsule, R-0Print      predniSONE (DELTASONE) 10 MG tablet Take 4 tablets by mouth once daily for 5 days, Disp-20 tablet, R-0Print      diphenhydrAMINE (BENADRYL) 25 MG capsule Take 1 capsule by mouth every 4 hours as needed for Itching, Disp-1 capsule, R-0Print                (Please note:  Portions of this note werecompleted with a voice recognition program.  Efforts were made to edit the dictations but occasionally words and phrases are mis-transcribed.)  Form v2016.J.5-cn    Rollene Fare., APRN - CNP (electronically signed)  Emergency Medicine Provider         Gerome Apley. Greig Castilla, APRN - CNP  02/28/18 [phone]

## 2018-02-28 NOTE — ED Triage Notes (Signed)
Presents to ED with complaint of rash for 2 weeks to arms, legs and trunk

## 2018-02-28 NOTE — Other (Unsigned)
Patient Acct Nbr: [id number]   Primary AUTH/CERT:   Primary Insurance Company Name: [employer]  Primary Insurance Plan name: [facility]  Primary Insurance Group Number:   Primary Insurance Plan Type: Health  Primary Insurance Policy Number: [id number]

## 2018-04-08 ENCOUNTER — Encounter
Admit: 2018-04-08 | Discharge: 2018-04-08 | Payer: MEDICAID | Primary: Student in an Organized Health Care Education/Training Program

## 2018-04-08 ENCOUNTER — Encounter: Primary: Student in an Organized Health Care Education/Training Program

## 2018-04-08 DIAGNOSIS — Z3042 Encounter for surveillance of injectable contraceptive: Secondary | ICD-10-CM

## 2018-04-08 MED ORDER — MEDROXYPROGESTERONE ACETATE 150 MG/ML IM SUSP
150 | INTRAMUSCULAR | Status: AC
Start: 2018-04-08 — End: 2018-04-08

## 2018-04-08 MED FILL — MEDROXYPROGESTERONE ACETATE 150 MG/ML IM SUSP: 150 mg/mL | INTRAMUSCULAR | Qty: 1

## 2018-04-08 NOTE — Progress Notes (Signed)
Faculty back up: Dr Ellwood Sayers.  Here for RN visit depo-provera. Last depo given 01/13/18. Denies questions /concerns related to depo-provera. Patient education hand out given. Next depo scheduled for 07/01/18.  Patientwas able to ambulate safely to the examination room.     Provider was not notified of possible fall risk

## 2018-04-08 NOTE — Other (Unsigned)
Patient Acct Nbr: [id number]   Primary AUTH/CERT:   Primary Insurance Company Name: [employer]  Primary Insurance Plan name: [facility]  Primary Insurance Group Number:   Primary Insurance Plan Type: Health  Primary Insurance Policy Number: [id number]

## 2018-07-01 ENCOUNTER — Encounter: Primary: Student in an Organized Health Care Education/Training Program

## 2018-07-02 ENCOUNTER — Encounter: Primary: Student in an Organized Health Care Education/Training Program

## 2018-07-16 ENCOUNTER — Encounter: Primary: Student in an Organized Health Care Education/Training Program

## 2018-08-07 ENCOUNTER — Encounter
Admit: 2018-08-07 | Discharge: 2018-08-07 | Payer: MEDICAID | Primary: Student in an Organized Health Care Education/Training Program

## 2018-08-07 DIAGNOSIS — Z3042 Encounter for surveillance of injectable contraceptive: Secondary | ICD-10-CM

## 2018-08-07 LAB — POCT URINE PREGNANCY: Preg Test, Ur: NEGATIVE

## 2018-08-07 MED ORDER — MEDROXYPROGESTERONE ACETATE 150 MG/ML IM SUSP
150 MG/ML | INTRAMUSCULAR | Status: AC
Start: 2018-08-07 — End: ?
  Administered 2018-08-07 – 2018-12-04 (×2): 150 mg via INTRAMUSCULAR

## 2018-08-07 MED ORDER — MEDROXYPROGESTERONE ACETATE 150 MG/ML IM SUSP
150 | INTRAMUSCULAR | Status: AC
Start: 2018-08-07 — End: 2018-08-07

## 2018-08-07 MED FILL — MEDROXYPROGESTERONE ACETATE 150 MG/ML IM SUSP: 150 mg/mL | INTRAMUSCULAR | Qty: 1

## 2018-08-07 NOTE — Other (Unsigned)
Patient Acct Nbr: [id number]   Primary AUTH/CERT:   Primary [employer] Company Name: [employer]  Primary Insurance Plan name: [facility]  Primary Insurance Group Number:   Primary Insurance Plan Type: Health  Primary Insurance Policy Number: [id number]

## 2018-08-07 NOTE — Progress Notes (Signed)
Dr. Gerre Scull Was Faculty Backup    Patientwas able to ambulate safely to the examination room.     Provider was not notified of possible fall risk    Pt advised to increase Ca+ in diet thru supplements or diet. Next depo appointment scheduled 12 weeks from now.  Pt interested in changing birth control. Scheduled appt with PCP to discuss.    Patient (or parent/guardian) refuses influenza immunization at this time.

## 2018-08-20 ENCOUNTER — Encounter: Primary: Student in an Organized Health Care Education/Training Program

## 2018-08-25 ENCOUNTER — Encounter
Attending: Student in an Organized Health Care Education/Training Program | Primary: Student in an Organized Health Care Education/Training Program

## 2018-10-14 NOTE — Telephone Encounter (Signed)
S: Pt called [facility] with question regarding her depo injection  B: Pt has upcoming appt on 12/18, last injection was 08/07/18  A: Pt was concerned that she may have missed her depo injection appt  Pt advised of upcoming date and time of appt for injection on 12/18. Pt also reminded that she missed her [facility] appt and will need to discuss rescheduling that appt.   R: Pt did not have further questions.   Reason for Disposition  ??? Requesting regular office appointment    Protocols used: INFORMATION ONLY CALL-ADULT-AH

## 2018-10-14 NOTE — Telephone Encounter (Signed)
This is appropriate timing. She will need to be scheduled for another well woman.    Thanks,  AT

## 2018-10-29 ENCOUNTER — Encounter: Primary: Student in an Organized Health Care Education/Training Program

## 2018-12-04 ENCOUNTER — Encounter
Admit: 2018-12-04 | Discharge: 2018-12-04 | Payer: MEDICAID | Primary: Student in an Organized Health Care Education/Training Program

## 2018-12-04 DIAGNOSIS — Z3042 Encounter for surveillance of injectable contraceptive: Secondary | ICD-10-CM

## 2018-12-04 LAB — POCT URINE PREGNANCY: Preg Test, Ur: NEGATIVE

## 2018-12-04 MED ORDER — MEDROXYPROGESTERONE ACETATE 150 MG/ML IM SUSP
150 | INTRAMUSCULAR | Status: AC
Start: 2018-12-04 — End: 2018-12-04

## 2018-12-04 MED FILL — MEDROXYPROGESTERONE ACETATE 150 MG/ML IM SUSP: 150 mg/mL | INTRAMUSCULAR | Qty: 1

## 2018-12-04 NOTE — Progress Notes (Signed)
Faculty back up: Dr Kennedy Bucker.  Here for RN visit: depo-provera. Last depo given 08/07/18.  18 weeks since last depo.  Urine HCG negative.  LMP last week. Continues with light spotting today.  Last sexual encounter- 2 days ago. Patient declines EC.  Depo given. Patient denies questions/concerns related to medication.  RN visit for urine HCG scheduled for 12/17/18.  Next depo scheduled for 02/25/19.  Patientwas able to ambulate safely to the examination room.     Provider was not notified of possible fall risk

## 2018-12-04 NOTE — Other (Unsigned)
Patient Acct Nbr: [id number]   Primary AUTH/CERT:   Primary Insurance Company Name: [employer]  Primary Insurance Plan name: [facility]  Primary Insurance Group Number:   Primary Insurance Plan Type: Health  Primary Insurance Policy Number: [id number]

## 2018-12-17 ENCOUNTER — Encounter: Primary: Student in an Organized Health Care Education/Training Program

## 2019-01-12 ENCOUNTER — Emergency Department (HOSPITAL_COMMUNITY): Payer: Medicaid - Out of State

## 2019-01-12 ENCOUNTER — Emergency Department (HOSPITAL_COMMUNITY)
Admission: EM | Admit: 2019-01-12 | Discharge: 2019-01-12 | Disposition: A | Discharge status: Home / Self Care | Payer: Medicaid - Out of State | Attending: Emergency Medicine | Admitting: Emergency Medicine

## 2019-01-12 ENCOUNTER — Encounter (HOSPITAL_COMMUNITY): Payer: Self-pay | Admitting: Emergency Medicine

## 2019-01-12 ENCOUNTER — Other Ambulatory Visit: Payer: Self-pay

## 2019-01-12 DIAGNOSIS — K0889 Other specified disorders of teeth and supporting structures: Secondary | ICD-10-CM | POA: Insufficient documentation

## 2019-01-12 DIAGNOSIS — Z79899 Other long term (current) drug therapy: Secondary | ICD-10-CM | POA: Diagnosis not present

## 2019-01-12 DIAGNOSIS — F17210 Nicotine dependence, cigarettes, uncomplicated: Secondary | ICD-10-CM | POA: Insufficient documentation

## 2019-01-12 LAB — CBC WITH DIFFERENTIAL/PLATELET
Abs Immature Granulocytes: 0.04 10*3/uL (ref 0.00–0.07)
BASOS ABS: 0 10*3/uL (ref 0.0–0.1)
BASOS PCT: 0 %
Eosinophils Absolute: 0 10*3/uL (ref 0.0–0.5)
Eosinophils Relative: 0 %
HCT: 40.8 % (ref 36.0–46.0)
Hemoglobin: 12.9 g/dL (ref 12.0–15.0)
Immature Granulocytes: 0 %
LYMPHS PCT: 19 %
Lymphs Abs: 2 10*3/uL (ref 0.7–4.0)
MCH: 27.6 pg (ref 26.0–34.0)
MCHC: 31.6 g/dL (ref 30.0–36.0)
MCV: 87.2 fL (ref 80.0–100.0)
Monocytes Absolute: 0.9 10*3/uL (ref 0.1–1.0)
Monocytes Relative: 8 %
NRBC: 0 % (ref 0.0–0.2)
Neutro Abs: 7.9 10*3/uL — ABNORMAL HIGH (ref 1.7–7.7)
Neutrophils Relative %: 73 %
PLATELETS: 309 10*3/uL (ref 150–400)
RBC: 4.68 MIL/uL (ref 3.87–5.11)
RDW: 15.1 % (ref 11.5–15.5)
WBC: 10.9 10*3/uL — AB (ref 4.0–10.5)

## 2019-01-12 LAB — COMPREHENSIVE METABOLIC PANEL
ALBUMIN: 4.4 g/dL (ref 3.5–5.0)
ALK PHOS: 66 U/L (ref 38–126)
ALT: 12 U/L (ref 0–44)
ANION GAP: 10 (ref 5–15)
AST: 16 U/L (ref 15–41)
BUN: 12 mg/dL (ref 6–20)
CHLORIDE: 106 mmol/L (ref 98–111)
CO2: 25 mmol/L (ref 22–32)
Calcium: 9.4 mg/dL (ref 8.9–10.3)
Creatinine, Ser: 0.65 mg/dL (ref 0.44–1.00)
GFR calc Af Amer: >60 mL/min (ref >60)
GFR calc non Af Amer: >60 mL/min (ref >60)
GLUCOSE: 85 mg/dL (ref 70–99)
POTASSIUM: 3.4 mmol/L — AB (ref 3.5–5.1)
SODIUM: 141 mmol/L (ref 135–145)
Total Bilirubin: 0.4 mg/dL (ref 0.3–1.2)
Total Protein: 8.1 g/dL (ref 6.5–8.1)

## 2019-01-12 LAB — I-STAT CREATININE, ED: Creatinine, Ser: 0.7 mg/dL (ref 0.44–1.00)

## 2019-01-12 MED ORDER — HYDROCODONE-ACETAMINOPHEN 5-325 MG PO TABS: 1.0000 | ORAL_TABLET | Freq: Four times a day (QID) | ORAL | 0 refills | Status: DC | PRN

## 2019-01-12 MED ORDER — SODIUM CHLORIDE (PF) 0.9 % IJ SOLN
INTRAMUSCULAR | Status: AC
  Filled 2019-01-12: qty 50

## 2019-01-12 MED ORDER — CLINDAMYCIN HCL 150 MG PO CAPS: 450.0000 mg | ORAL_CAPSULE | Freq: Three times a day (TID) | ORAL | 0 refills | Status: AC

## 2019-01-12 MED ORDER — KETOROLAC TROMETHAMINE 15 MG/ML IJ SOLN
15.0000 mg | Freq: Once | INTRAMUSCULAR | Status: AC
Start: 2019-01-12 — End: 2019-01-12
  Administered 2019-01-12: 15 mg via INTRAVENOUS
  Filled 2019-01-12: qty 1

## 2019-01-12 MED ORDER — MORPHINE SULFATE (PF) 4 MG/ML IV SOLN
4.0000 mg | Freq: Once | INTRAVENOUS | Status: AC
  Administered 2019-01-12: 4 mg via INTRAVENOUS
  Filled 2019-01-12: qty 1

## 2019-01-12 MED ORDER — IOHEXOL 300 MG/ML  SOLN
75.0000 mL | Freq: Once | INTRAMUSCULAR | Status: AC | PRN
  Administered 2019-01-12: 75 mL via INTRAVENOUS

## 2019-01-12 MED ORDER — CLINDAMYCIN PHOSPHATE 600 MG/50ML IV SOLN
600.0000 mg | Freq: Once | INTRAVENOUS | Status: AC
  Administered 2019-01-12: 600 mg via INTRAVENOUS
  Filled 2019-01-12: qty 50

## 2019-01-12 NOTE — ED Notes (Signed)
Pt calling out asking for more pain medication. PA made aware. Per PA: additional pain medication can be administered after CT scan to reveal issue. Pt provided with ice pack and updated on plan of care.

## 2019-01-12 NOTE — ED Provider Notes (Signed)
[facility]-EMERGENCY DEPT Provider Note   CSN: [id number] Arrival date & time: 01/12/19  1102    History   Chief Complaint Chief Complaint  Patient presents with  . Dental Pain    HPI Jenny Schwartz is a 23 y.o. female otherwise healthy presented emergency department today with chief complaint of dental pain x3 days.  States that she cracked a tooth a month and a half ago.  Her current symptoms started as pain in her right lower jaw.  She put Orajel on her right lower gums without improvement.  She describes the pain as throbbing.  Is constant it radiates to her head and down her neck.  States the pain is 10 out of 10 in severity.  The next day later patient had significant swelling to the right side of her face going into her neck.  Patient reports difficulty eating, swallowing and feels short of breath.  She denies fever.  Her last dental cleaning was over 6 months ago.  History provided by patient.       History reviewed. No pertinent past medical history.  There are no active problems to display for this patient.   History reviewed. No pertinent surgical history.   OB History   No obstetric history on file.      Home Medications    Prior to Admission medications   Medication Sig Start Date End Date Taking? Authorizing Provider  clindamycin (CLEOCIN) 150 MG capsule Take 3 capsules (450 mg total) by mouth 3 (three) times daily for 7 days. 01/12/19 01/19/19  Tiani Stanbery E, PA-C  HYDROcodone-acetaminophen (NORCO/VICODIN) 5-325 MG tablet Take 1 tablet by mouth every 6 (six) hours as needed for severe pain. 01/12/19   Aaleeyah Bias E, PA-C  naproxen (NAPROSYN) 250 MG tablet Take 1 tablet (250 mg total) by mouth 2 (two) times daily with a meal. 03/24/17   Everlene Farrier, PA-C  Olopatadine HCl 0.2 % SOLN 2 gtts in affected eye tid prn for itchiness. Patient not taking: Reported on 03/19/2017 02/27/12   Moreno-Coll, Adlih, MD  ondansetron (ZOFRAN ODT) 4 MG  disintegrating tablet 4mg  ODT q4 hours prn nausea/vomit 03/21/17   Bethann Berkshire, MD  traMADol (ULTRAM) 50 MG tablet Take 1 tablet (50 mg total) by mouth every 6 (six) hours as needed. Patient taking differently: Take 50 mg by mouth every 6 (six) hours as needed for moderate pain.  03/21/17   Bethann Berkshire, MD    Family History No family history on file.  Social History Social History   Tobacco Use  . Smoking status: Current Some Day Smoker    Types: Cigars  . Smokeless tobacco: Never Used  Substance Use Topics  . Alcohol use: No  . Drug use: No     Allergies   Patient has no known allergies.   Review of Systems Review of Systems  HENT: Positive for dental problem.   All other systems reviewed and are negative.    Physical Exam Updated Vital Signs BP 118/61 (BP Location: Left Arm)   Pulse 91   Temp 98.5 F (36.9 C) (Oral)   Resp 18   LMP 12/22/2018   SpO2 100%   Physical Exam Vitals signs and nursing note reviewed.  Constitutional:      General: She is not in acute distress.    Appearance: She is well-developed. She is not ill-appearing or toxic-appearing.  HENT:     Head: Normocephalic.     Mouth/Throat:      Comments:  External exam shows asymmetry of the jaw line and face with significant swelling to right lower jaw and neck. Dentition with multiple caries and fractures.No trismus. Mouth opening to at least 3 finger widths. Jaw with decreased range of motion secondary to pain  Handles oral secretions without difficulty. Swelling and tenderness to the right side submental  region. No  tenderness into the soft tissues of the neck. Neck is supple with no tenderness to palpation of the soft tissues.   Gumline palpated no obvious signs of infection including no warmth, redness, abscess, tenderness. Posterior oropharynx clear with no signs of infection, uvula is midline and rises with phonation.   Eyes:     General: No scleral icterus.       Right eye: No  discharge.        Left eye: No discharge.     Conjunctiva/sclera: Conjunctivae normal.  Neck:     Musculoskeletal: Normal range of motion.  Cardiovascular:     Rate and Rhythm: Normal rate and regular rhythm.     Pulses: Normal pulses.     Heart sounds: Normal heart sounds.  Pulmonary:     Effort: Pulmonary effort is normal.     Breath sounds: Normal breath sounds.  Abdominal:     General: There is no distension.  Musculoskeletal: Normal range of motion.  Skin:    General: Skin is warm and dry.  Neurological:     Mental Status: She is oriented to person, place, and time.     Comments: Fluent speech, no facial droop.  Psychiatric:        Behavior: Behavior normal.          ED Treatments / Results  Labs (all labs ordered are listed, but only abnormal results are displayed) Labs Reviewed  CBC WITH DIFFERENTIAL/PLATELET - Abnormal; Notable for the following components:      Result Value   WBC 10.9 (*)    Neutro Abs 7.9 (*)    All other components within normal limits  COMPREHENSIVE METABOLIC PANEL - Abnormal; Notable for the following components:   Potassium 3.4 (*)    All other components within normal limits  I-STAT CREATININE, ED    EKG None  Radiology Ct Maxillofacial W Contrast  Result Date: 01/12/2019 CLINICAL DATA:  Dental pain for 2 days, worsening swelling. EXAM: CT MAXILLOFACIAL WITH CONTRAST TECHNIQUE: Multidetector CT imaging of the maxillofacial structures was performed with intravenous contrast. Multiplanar CT image reconstructions were also generated. CONTRAST:  75mL OMNIPAQUE IOHEXOL 300 MG/ML  SOLN COMPARISON:  CT neck Mar 19, 2017. FINDINGS: OSSEOUS: Poor dentition with multiple dental caries and periapical lucencies including RIGHT mandible molar and tooth 4. No facial fracture. ORBITS: Ocular globes and orbital contents are normal. SINUSES: Mild paranasal sinus mucosal thickening without air-fluid levels. Hypoplastic frontal sinuses. Mastoid air cells  are well aerated. SOFT TISSUES: RIGHT lower facial soft tissue swelling with subcutaneous fat stranding, thickened platysma and effusion. Limited assessment for drainable fluid collections due to streak artifact from dental amalgam. Suspected RIGHT mandible body subperiosteal abscess. LIMITED INTRACRANIAL: Normal. IMPRESSION: 1. RIGHT lower facial cellulitis, likely odontogenic given poor dentition. Suspected RIGHT mandible body subperiosteal abscess though limited assessment due to CT dental artifact. 2. Mild paranasal sinusitis. Electronically Signed   By: Awilda Metroourtnay  Bloomer M.D.   On: 01/12/2019 17:30    Procedures Procedures (including critical care time)  Medications Ordered in ED Medications  sodium chloride (PF) 0.9 % injection (has no administration in time range)  morphine 4  MG/ML injection 4 mg (4 mg Intravenous Given 01/12/19 1525)  iohexol (OMNIPAQUE) 300 MG/ML solution 75 mL (75 mLs Intravenous Contrast Given 01/12/19 1703)  ketorolac (TORADOL) 15 MG/ML injection 15 mg (15 mg Intravenous Given 01/12/19 1824)  clindamycin (CLEOCIN) IVPB 600 mg (0 mg Intravenous Stopped 01/12/19 1854)     Initial Impression / Assessment and Plan / ED Course  I have reviewed the triage vital signs and the nursing notes.  Pertinent labs & imaging results that were available during my care of the patient were reviewed by me and considered in my medical decision making (see chart for details).    Pt is afebrile and presents for dental pain x3 days.  Exam shows chronic dental decay and significant swelling to right lower jaw and right lateral neck. Exam unconcerning for Ludwig's angina or spread of infection. CT scan shows right lower facial cellulitis and suspected right mandible body subperiosteal abscess.  Likely cause of cellulitis is odontogenic secondary to patient's poor dentition.  CBC with leukocytosis of 10.9, otherwise unremarkable.  CMP unremarkable.  Will be given dose of IV Clindamycin and discharged  home on p.o. Clindamycin. Recommend anti-inflammatories medicine. Urged patient to follow-up with dentist.   Patient is hemodynamically stable, in NAD, and able to ambulate in the ED. Evaluation does not show pathology that would require ongoing emergent intervention or inpatient treatment. I explained the diagnosis to the patient. Pain has been managed and has no complaints prior to discharge. Patient is comfortable with above plan and is stable for discharge at this time. All questions were answered prior to disposition. Strict return precautions for returning to the ED were discussed.       Final Clinical Impressions(s) / ED Diagnoses   Final diagnoses:  Pain, dental    ED Discharge Orders         Ordered    clindamycin (CLEOCIN) 150 MG capsule  3 times daily     01/12/19 1849    HYDROcodone-acetaminophen (NORCO/VICODIN) 5-325 MG tablet  Every 6 hours PRN     01/12/19 1849           Sherene Sires, PA-C 01/12/19 2047    Samuel Jester, DO 01/17/19 1841

## 2019-01-12 NOTE — Discharge Instructions (Addendum)
Apply warm compresses to jaw throughout the day. Take antibiotic until completion.  You can also take the Profen for pain.  However do not take any Tylenol because the pain pill hydrocodone that I prescribed for you has Tylenol in it already.  Take hydrocodone-acetaminophen as directed, as needed for pain but do not drive or operate machinery with pain medication use.   Follow up with a dentist is very important for ongoing evaluation and management of recurrent dental pain. Return to emergency department for emergent changing or worsening symptoms.  We do not have a dentist on call today.  You should call around in the area to find a dentist that is accepting new patients with your insurance. One  that I recommend calling his Dr. Loura Pardon his phone number is [phone] to see if he is able to see you for follow-up

## 2019-01-12 NOTE — ED Notes (Signed)
Patient transported to CT 

## 2019-01-12 NOTE — ED Triage Notes (Signed)
Pt having right lower dental pain with swelling sinc Saturday.

## 2019-02-25 ENCOUNTER — Encounter: Primary: Student in an Organized Health Care Education/Training Program

## 2020-03-22 ENCOUNTER — Emergency Department (HOSPITAL_COMMUNITY): Payer: Medicaid - Out of State

## 2020-03-22 ENCOUNTER — Other Ambulatory Visit: Payer: Self-pay

## 2020-03-22 ENCOUNTER — Encounter (HOSPITAL_COMMUNITY): Payer: Self-pay

## 2020-03-22 ENCOUNTER — Emergency Department (HOSPITAL_COMMUNITY)
Admission: EM | Admit: 2020-03-22 | Discharge: 2020-03-23 | Disposition: A | Discharge status: Home / Self Care | Payer: Medicaid - Out of State | Attending: Emergency Medicine | Admitting: Emergency Medicine

## 2020-03-22 DIAGNOSIS — N390 Urinary tract infection, site not specified: Secondary | ICD-10-CM | POA: Diagnosis not present

## 2020-03-22 DIAGNOSIS — R0789 Other chest pain: Secondary | ICD-10-CM | POA: Insufficient documentation

## 2020-03-22 DIAGNOSIS — R0602 Shortness of breath: Secondary | ICD-10-CM | POA: Diagnosis not present

## 2020-03-22 DIAGNOSIS — F17290 Nicotine dependence, other tobacco product, uncomplicated: Secondary | ICD-10-CM | POA: Diagnosis not present

## 2020-03-22 DIAGNOSIS — Z79899 Other long term (current) drug therapy: Secondary | ICD-10-CM | POA: Insufficient documentation

## 2020-03-22 LAB — URINALYSIS, ROUTINE W REFLEX MICROSCOPIC
Bilirubin Urine: NEGATIVE
Glucose, UA: NEGATIVE mg/dL
Hgb urine dipstick: NEGATIVE
Ketones, ur: NEGATIVE mg/dL
Nitrite: POSITIVE — AB
Protein, ur: NEGATIVE mg/dL
Specific Gravity, Urine: 1.016 (ref 1.005–1.030)
WBC, UA: >50 WBC/hpf — ABNORMAL HIGH (ref 0–5)
pH: 6 (ref 5.0–8.0)

## 2020-03-22 LAB — CBC
HCT: 33.7 % — ABNORMAL LOW (ref 36.0–46.0)
Hemoglobin: 10.3 g/dL — ABNORMAL LOW (ref 12.0–15.0)
MCH: 24.2 pg — ABNORMAL LOW (ref 26.0–34.0)
MCHC: 30.6 g/dL (ref 30.0–36.0)
MCV: 79.1 fL — ABNORMAL LOW (ref 80.0–100.0)
Platelets: 387 10*3/uL (ref 150–400)
RBC: 4.26 MIL/uL (ref 3.87–5.11)
RDW: 16.5 % — ABNORMAL HIGH (ref 11.5–15.5)
WBC: 9.6 10*3/uL (ref 4.0–10.5)
nRBC: 0 % (ref 0.0–0.2)

## 2020-03-22 LAB — BASIC METABOLIC PANEL
Anion gap: 8 (ref 5–15)
BUN: 9 mg/dL (ref 6–20)
CO2: 27 mmol/L (ref 22–32)
Calcium: 9 mg/dL (ref 8.9–10.3)
Chloride: 105 mmol/L (ref 98–111)
Creatinine, Ser: 0.94 mg/dL (ref 0.44–1.00)
GFR calc Af Amer: >60 mL/min (ref >60)
GFR calc non Af Amer: >60 mL/min (ref >60)
Glucose, Bld: 92 mg/dL (ref 70–99)
Potassium: 3.6 mmol/L (ref 3.5–5.1)
Sodium: 140 mmol/L (ref 135–145)

## 2020-03-22 LAB — I-STAT BETA HCG BLOOD, ED (MC, WL, AP ONLY): I-stat hCG, quantitative: <5 m[IU]/mL (ref <5)

## 2020-03-22 LAB — TROPONIN I (HIGH SENSITIVITY): Troponin I (High Sensitivity): <2 ng/L (ref <18)

## 2020-03-22 MED ORDER — IBUPROFEN 800 MG PO TABS
800.0000 mg | ORAL_TABLET | Freq: Once | ORAL | Status: AC
  Administered 2020-03-22: 800 mg via ORAL
  Filled 2020-03-22: qty 1

## 2020-03-22 MED ORDER — SODIUM CHLORIDE 0.9% FLUSH
3.0000 mL | Freq: Once | INTRAVENOUS | Status: AC
  Administered 2020-03-22: 22:00:00 3 mL via INTRAVENOUS

## 2020-03-22 NOTE — ED Provider Notes (Signed)
TIME SEEN: 11:19 PM  CHIEF COMPLAINT: Right-sided chest pain  HPI: Patient is a 24 year old female with no significant past medical history presents to the emergency department with right-sided chest pain started yesterday and she describes as feeling like a tight feeling.  She states that she has had these intermittent symptoms due to having large breasts.  She thinks that is what is causing her discomfort today but wanted to "get checked out" to make sure there is nothing worse.  No associated shortness of breath, fevers, cough, nausea, vomiting, diarrhea, lower extremity swelling or pain.  She has been a smoker.  No history of PE, DVT, exogenous estrogen use, recent fractures, surgery, trauma, hospitalization or prolonged travel. No lower extremity swelling or pain. No calf tenderness.  No abdominal pain.  Breast is nontender.  No breast masses, nipple discharge or bleeding.   ROS: See HPI Constitutional: no fever  Eyes: no drainage  ENT: no runny nose   Cardiovascular:   chest pain  Resp: no SOB  GI: no vomiting GU: no dysuria Integumentary: no rash  Allergy: no hives  Musculoskeletal: no leg swelling  Neurological: no slurred speech ROS otherwise negative  PAST MEDICAL HISTORY/PAST SURGICAL HISTORY:  History reviewed. No pertinent past medical history.  MEDICATIONS:  Prior to Admission medications   Medication Sig Start Date End Date Taking? Authorizing Provider  HYDROcodone-acetaminophen (NORCO/VICODIN) 5-325 MG tablet Take 1 tablet by mouth every 6 (six) hours as needed for severe pain. 01/12/19   Albrizze, Kaitlyn E, PA-C  naproxen (NAPROSYN) 250 MG tablet Take 1 tablet (250 mg total) by mouth 2 (two) times daily with a meal. 03/24/17   Waynetta Pean, PA-C  Olopatadine HCl 0.2 % SOLN 2 gtts in affected eye tid prn for itchiness. Patient not taking: Reported on 03/19/2017 02/27/12   Moreno-Coll, Adlih, MD  ondansetron (ZOFRAN ODT) 4 MG disintegrating tablet 4mg  ODT q4 hours prn  nausea/vomit 03/21/17   Milton Ferguson, MD  traMADol (ULTRAM) 50 MG tablet Take 1 tablet (50 mg total) by mouth every 6 (six) hours as needed. Patient taking differently: Take 50 mg by mouth every 6 (six) hours as needed for moderate pain.  03/21/17   Milton Ferguson, MD    ALLERGIES:  No Known Allergies  SOCIAL HISTORY:  Social History   Tobacco Use  . Smoking status: Current Some Day Smoker    Types: Cigars  . Smokeless tobacco: Never Used  Substance Use Topics  . Alcohol use: No    FAMILY HISTORY: No family history on file.  EXAM: BP 118/74   Pulse 93   Temp 98.5 F (36.9 C) (Oral)   Resp 17   Ht 5\' 9"  (1.753 m)   Wt 83.5 kg   LMP 03/08/2020 (Approximate)   SpO2 96%   BMI 27.17 kg/m  CONSTITUTIONAL: Alert and oriented and responds appropriately to questions. Well-appearing; well-nourished HEAD: Normocephalic EYES: Conjunctivae clear, pupils appear equal, EOM appear intact ENT: normal nose; moist mucous membranes NECK: Supple, normal ROM CARD: RRR; S1 and S2 appreciated; no murmurs, no clicks, no rubs, no gallops CHEST:  Chest wall is tender to palpation over the anterior right lower chest wall.  No crepitus, ecchymosis, erythema, warmth, rash or other lesions present.   BREAST:  No breast mass palpated on exam.  Right breast is non-tender to palpation.  No erythema, warmth, induration, fluctuance.  No nipple discharge.  No inverted nipple.  No skin changes.   RESP: Normal chest excursion without splinting or tachypnea; breath  sounds clear and equal bilaterally; no wheezes, no rhonchi, no rales, no hypoxia or respiratory distress, speaking full sentences ABD/GI: Normal bowel sounds; non-distended; soft, non-tender, no rebound, no guarding, no peritoneal signs, no hepatosplenomegaly, no tenderness in the right upper quadrant negative Murphy sign BACK:  The back appears normal EXT: Normal ROM in all joints; no deformity noted, no edema; no cyanosis no calf tenderness or calf  swelling SKIN: Normal color for age and race; warm; no rash on exposed skin NEURO: Moves all extremities equally PSYCH: The patient's mood and manner are appropriate.   MEDICAL DECISION MAKING: Patient here with atypical chest pain.  Suspect chest wall pain due to large, pendulous breasts.  History of the same.  Doubt ACS, PE, dissection, pneumonia, pneumothorax, esophageal rupture.  No sign of breast cellulitis, abscess on exam.  Will provide ibuprofen for pain.  Labs, chest x-ray ordered in triage currently pending.  EKG reviewed/interpreted and shows no ischemia, arrhythmia or interval abnormality.  ED PROGRESS: Patient's labs and imaging reviewed/interpreted and show no acute abnormality.  She did report dysuria to nursing staff in triage and appears to have a UTI here.  Culture pending.  No previous urine cultures for comparison.  Will discharge on Keflex.  She denies any urinary frequency, urgency, hematuria, vaginal bleeding or discharge.  No abdominal pain on exam.  At this time, I do not feel there is any life-threatening condition present. I have reviewed, interpreted and discussed all results (EKG, imaging, lab, urine as appropriate) and exam findings with patient/family. I have reviewed nursing notes and appropriate previous records.  I feel the patient is safe to be discharged home without further emergent workup and can continue workup as an outpatient as needed. Discussed usual and customary return precautions. Patient/family verbalize understanding and are comfortable with this plan.  Outpatient follow-up has been provided as needed. All questions have been answered.     EKG Interpretation  Date/Time:  Tuesday Mar 22 2020 22:21:38 EDT Ventricular Rate:  91 PR Interval:    QRS Duration: 107 QT Interval:  370 QTC Calculation: 456 R Axis:   38 Text Interpretation: Sinus rhythm RSR' in V1 or V2, right VCD or RVH No old tracing to compare Confirmed by Meridee Score [phone]) on  03/22/2020 10:38:37 PM          Jenny Schwartz was evaluated in Emergency Department on 03/22/2020 for the symptoms described in the history of present illness. She was evaluated in the context of the global COVID-19 pandemic, which necessitated consideration that the patient might be at risk for infection with the SARS-CoV-2 virus that causes COVID-19. Institutional protocols and algorithms that pertain to the evaluation of patients at risk for COVID-19 are in a state of rapid change based on information released by regulatory bodies including the CDC and federal and state organizations. These policies and algorithms were followed during the patient's care in the ED.      Kentarius Partington, Layla Maw, DO 03/23/20 [id number]

## 2020-03-22 NOTE — ED Triage Notes (Signed)
Pt coming in c/o right side chest pain radiating to lower right back that started two days ago. Endorses nausea and bladder pain from not emptying it completely. No burning with urination.

## 2020-03-22 NOTE — ED Notes (Signed)
Pt is aware of urine smaple

## 2020-03-23 MED ORDER — CEPHALEXIN 500 MG PO CAPS
500.0000 mg | ORAL_CAPSULE | Freq: Two times a day (BID) | ORAL | 0 refills | Status: DC
Start: 2020-03-23 — End: 2020-09-28

## 2020-03-23 MED ORDER — CEPHALEXIN 500 MG PO CAPS
500.0000 mg | ORAL_CAPSULE | Freq: Once | ORAL | Status: AC
  Administered 2020-03-23: 500 mg via ORAL
  Filled 2020-03-23: qty 1

## 2020-03-23 NOTE — Discharge Instructions (Signed)
You may alternate Tylenol 1000 mg every 6 hours as needed for pain and Ibuprofen 800 mg every 8 hours as needed for pain.  Please take Ibuprofen with food.  Do not take more than 4000 mg of Tylenol (acetaminophen) in a 24 hour period.  

## 2020-03-23 NOTE — ED Notes (Signed)
Lab contacted to add on urine culture. 

## 2020-03-24 LAB — URINE CULTURE: Culture: >=100000 — AB

## 2020-09-28 ENCOUNTER — Encounter (HOSPITAL_COMMUNITY): Payer: Self-pay | Admitting: *Deleted

## 2020-09-28 ENCOUNTER — Other Ambulatory Visit: Payer: Self-pay

## 2020-09-28 ENCOUNTER — Emergency Department (HOSPITAL_COMMUNITY): Payer: Medicaid - Out of State

## 2020-09-28 ENCOUNTER — Emergency Department (HOSPITAL_COMMUNITY)
Admission: EM | Admit: 2020-09-28 | Discharge: 2020-09-28 | Disposition: A | Discharge status: Home / Self Care | Payer: Medicaid - Out of State | Attending: Emergency Medicine | Admitting: Emergency Medicine

## 2020-09-28 DIAGNOSIS — R102 Pelvic and perineal pain: Secondary | ICD-10-CM | POA: Insufficient documentation

## 2020-09-28 DIAGNOSIS — N939 Abnormal uterine and vaginal bleeding, unspecified: Secondary | ICD-10-CM | POA: Diagnosis not present

## 2020-09-28 DIAGNOSIS — F17290 Nicotine dependence, other tobacco product, uncomplicated: Secondary | ICD-10-CM | POA: Insufficient documentation

## 2020-09-28 LAB — URINALYSIS, ROUTINE W REFLEX MICROSCOPIC
Bilirubin Urine: NEGATIVE
Glucose, UA: NEGATIVE mg/dL
Hgb urine dipstick: NEGATIVE
Ketones, ur: NEGATIVE mg/dL
Leukocytes,Ua: NEGATIVE
Nitrite: NEGATIVE
Protein, ur: NEGATIVE mg/dL
Specific Gravity, Urine: 1.018 (ref 1.005–1.030)
pH: 6 (ref 5.0–8.0)

## 2020-09-28 LAB — CBC WITH DIFFERENTIAL/PLATELET
Abs Immature Granulocytes: 0.02 10*3/uL (ref 0.00–0.07)
Basophils Absolute: 0 10*3/uL (ref 0.0–0.1)
Basophils Relative: 0 %
Eosinophils Absolute: 0.1 10*3/uL (ref 0.0–0.5)
Eosinophils Relative: 1 %
HCT: 35 % — ABNORMAL LOW (ref 36.0–46.0)
Hemoglobin: 10.8 g/dL — ABNORMAL LOW (ref 12.0–15.0)
Immature Granulocytes: 0 %
Lymphocytes Relative: 24 %
Lymphs Abs: 1.8 10*3/uL (ref 0.7–4.0)
MCH: 25 pg — ABNORMAL LOW (ref 26.0–34.0)
MCHC: 30.9 g/dL (ref 30.0–36.0)
MCV: 81 fL (ref 80.0–100.0)
Monocytes Absolute: 0.7 10*3/uL (ref 0.1–1.0)
Monocytes Relative: 9 %
Neutro Abs: 4.9 10*3/uL (ref 1.7–7.7)
Neutrophils Relative %: 66 %
Platelets: 357 10*3/uL (ref 150–400)
RBC: 4.32 MIL/uL (ref 3.87–5.11)
RDW: 16.6 % — ABNORMAL HIGH (ref 11.5–15.5)
WBC: 7.5 10*3/uL (ref 4.0–10.5)
nRBC: 0 % (ref 0.0–0.2)

## 2020-09-28 LAB — WET PREP, GENITAL
Sperm: NONE SEEN
Trich, Wet Prep: NONE SEEN
Yeast Wet Prep HPF POC: NONE SEEN

## 2020-09-28 LAB — LIPASE, BLOOD: Lipase: 26 U/L (ref 11–51)

## 2020-09-28 LAB — COMPREHENSIVE METABOLIC PANEL
ALT: 9 U/L (ref 0–44)
AST: 13 U/L — ABNORMAL LOW (ref 15–41)
Albumin: 3.9 g/dL (ref 3.5–5.0)
Alkaline Phosphatase: 54 U/L (ref 38–126)
Anion gap: 7 (ref 5–15)
BUN: 10 mg/dL (ref 6–20)
CO2: 27 mmol/L (ref 22–32)
Calcium: 8.7 mg/dL — ABNORMAL LOW (ref 8.9–10.3)
Chloride: 105 mmol/L (ref 98–111)
Creatinine, Ser: 0.71 mg/dL (ref 0.44–1.00)
GFR, Estimated: >60 mL/min (ref >60)
Glucose, Bld: 89 mg/dL (ref 70–99)
Potassium: 3.4 mmol/L — ABNORMAL LOW (ref 3.5–5.1)
Sodium: 139 mmol/L (ref 135–145)
Total Bilirubin: 0.3 mg/dL (ref 0.3–1.2)
Total Protein: 7 g/dL (ref 6.5–8.1)

## 2020-09-28 LAB — I-STAT BETA HCG BLOOD, ED (MC, WL, AP ONLY): I-stat hCG, quantitative: <5 m[IU]/mL (ref <5)

## 2020-09-28 MED ORDER — LIDOCAINE HCL 1 % IJ SOLN
INTRAMUSCULAR | Status: AC
  Administered 2020-09-28: 1 mL
  Filled 2020-09-28: qty 20

## 2020-09-28 MED ORDER — DOXYCYCLINE HYCLATE 100 MG PO CAPS: 100.0000 mg | ORAL_CAPSULE | Freq: Two times a day (BID) | ORAL | 0 refills | Status: AC

## 2020-09-28 MED ORDER — CEFTRIAXONE SODIUM 1 G IJ SOLR
500.0000 mg | Freq: Once | INTRAMUSCULAR | Status: AC
  Administered 2020-09-28: 500 mg via INTRAMUSCULAR
  Filled 2020-09-28: qty 10

## 2020-09-28 MED ORDER — IBUPROFEN 800 MG PO TABS
800.0000 mg | ORAL_TABLET | Freq: Once | ORAL | Status: AC
  Administered 2020-09-28: 800 mg via ORAL
  Filled 2020-09-28: qty 1

## 2020-09-28 MED ORDER — METRONIDAZOLE 500 MG PO TABS: 500.0000 mg | ORAL_TABLET | Freq: Two times a day (BID) | ORAL | 0 refills | Status: AC

## 2020-09-28 NOTE — ED Provider Notes (Signed)
[facility]-EMERGENCY DEPT Provider Note   CSN: [id number] Arrival date & time: 09/28/20  [id number]     History Chief Complaint  Patient presents with  . Abdominal Pain  . Vaginal Bleeding    Jenny Schwartz is a 24 y.o. female with no pertinent past medical history that presents the emergency department today for abdominal pain/pelvic pain and vaginal spotting.  Patient states that she has had abdominal pain for the past 3 days that is been worsening, in her suprapubic region.  States that she feels as if it is more in her pelvis.  Feels like a cramping sensation, states that she also noted some vaginal pink bleeding when she wiped today.  Unsure if it is coming from her urethra or vagina.  Cramping when she urinates, no true dysuria.Patient denies any vaginal discharge or vaginal itching.  States that her last menstrual period was 1-1/2 weeks ago and was normal for her.  Patient is not on birth control.  Denies any risky sexual behavior, is in a monogamous relationship with her boyfriend currently.  Denies any nausea, vomiting, fevers, chills, back pain.  Suprapubic pain does not radiate anywhere.  No urinary incontinence or urinary frequency.  Has not taken anything for this.  Denies any sick contacts.  States that she was in normal health before this.  HPI     History reviewed. No pertinent past medical history.  There are no problems to display for this patient.   History reviewed. No pertinent surgical history.   OB History   No obstetric history on file.     No family history on file.  Social History   Tobacco Use  . Smoking status: Current Some Day Smoker    Types: Cigars  . Smokeless tobacco: Never Used  Vaping Use  . Vaping Use: Never used  Substance Use Topics  . Alcohol use: No  . Drug use: No    Home Medications Prior to Admission medications   Medication Sig Start Date End Date Taking? Authorizing Provider  ibuprofen (ADVIL) 200 MG tablet  Take 400 mg by mouth every 6 (six) hours as needed.   Yes [provider]  doxycycline (VIBRAMYCIN) 100 MG capsule Take 1 capsule (100 mg total) by mouth 2 (two) times daily for 14 days. 09/28/20 10/12/20  Farrel GordonPatel, Florence Antonelli, PA-C  HYDROcodone-acetaminophen (NORCO/VICODIN) 5-325 MG tablet Take 1 tablet by mouth every 6 (six) hours as needed for severe pain. Patient not taking: Reported on 03/22/2020 01/12/19   Shanon AceWalisiewicz, Kaitlyn E, PA-C  metroNIDAZOLE (FLAGYL) 500 MG tablet Take 1 tablet (500 mg total) by mouth 2 (two) times daily for 14 days. 09/28/20 10/12/20  Farrel GordonPatel, Kaesha Kirsch, PA-C  naproxen (NAPROSYN) 250 MG tablet Take 1 tablet (250 mg total) by mouth 2 (two) times daily with a meal. Patient not taking: Reported on 03/22/2020 03/24/17   Everlene Farrieransie, William, PA-C  Olopatadine HCl 0.2 % SOLN 2 gtts in affected eye tid prn for itchiness. Patient not taking: Reported on 03/19/2017 02/27/12   Moreno-Coll, Adlih, MD  ondansetron (ZOFRAN ODT) 4 MG disintegrating tablet 4mg  ODT q4 hours prn nausea/vomit Patient not taking: Reported on 03/22/2020 03/21/17   Bethann BerkshireZammit, Joseph, MD  traMADol (ULTRAM) 50 MG tablet Take 1 tablet (50 mg total) by mouth every 6 (six) hours as needed. Patient not taking: Reported on 03/22/2020 03/21/17   Bethann BerkshireZammit, Joseph, MD    Allergies    Patient has no known allergies.  Review of Systems   Review of Systems  Constitutional: Negative for chills, diaphoresis, fatigue and fever.  HENT: Negative for congestion, sore throat and trouble swallowing.   Eyes: Negative for pain and visual disturbance.  Respiratory: Negative for cough, shortness of breath and wheezing.   Cardiovascular: Negative for chest pain, palpitations and leg swelling.  Gastrointestinal: Positive for abdominal pain. Negative for abdominal distention, diarrhea, nausea and vomiting.  Genitourinary: Positive for dysuria and vaginal bleeding. Negative for difficulty urinating, flank pain, frequency and menstrual problem.    Musculoskeletal: Negative for back pain, neck pain and neck stiffness.  Skin: Negative for pallor.  Neurological: Negative for dizziness, speech difficulty, weakness and headaches.  Psychiatric/Behavioral: Negative for confusion.    Physical Exam Updated Vital Signs BP 122/62   Pulse 76   Temp 98.9 F (37.2 C) (Oral)   Resp 17   LMP 09/17/2020   SpO2 99%   Physical Exam Constitutional:      General: She is not in acute distress.    Appearance: Normal appearance. She is not ill-appearing, toxic-appearing or diaphoretic.  HENT:     Mouth/Throat:     Mouth: Mucous membranes are moist.     Pharynx: Oropharynx is clear.  Eyes:     General: No scleral icterus.    Extraocular Movements: Extraocular movements intact.     Pupils: Pupils are equal, round, and reactive to light.  Cardiovascular:     Rate and Rhythm: Normal rate and regular rhythm.     Pulses: Normal pulses.     Heart sounds: Normal heart sounds.  Pulmonary:     Effort: Pulmonary effort is normal. No respiratory distress.     Breath sounds: Normal breath sounds. No stridor. No wheezing, rhonchi or rales.  Chest:     Chest wall: No tenderness.  Abdominal:     General: Abdomen is flat. There is no distension.     Palpations: Abdomen is soft.     Tenderness: There is abdominal tenderness (suprapubic ). There is no guarding or rebound.  Genitourinary:    Comments: Chaperone present.  Vagina normal with some white discharge noted.  Cervix also had some white discharge, os closed.  Tenderness to small speculum, patient did have cervical motion tenderness to bimanual.  Did also have tenderness to right and left adnexa.  No bleeding noted. Musculoskeletal:        General: No swelling or tenderness. Normal range of motion.     Cervical back: Normal range of motion and neck supple. No rigidity.     Right lower leg: No edema.     Left lower leg: No edema.  Skin:    General: Skin is warm and dry.     Capillary Refill:  Capillary refill takes less than 2 seconds.     Coloration: Skin is not pale.  Neurological:     General: No focal deficit present.     Mental Status: She is alert and oriented to person, place, and time.  Psychiatric:        Mood and Affect: Mood normal.        Behavior: Behavior normal.     ED Results / Procedures / Treatments   Labs (all labs ordered are listed, but only abnormal results are displayed) Labs Reviewed  WET PREP, GENITAL - Abnormal; Notable for the following components:      Result Value   Clue Cells Wet Prep HPF POC PRESENT (*)    WBC, Wet Prep HPF POC MANY (*)    All other components within normal  limits  URINALYSIS, ROUTINE W REFLEX MICROSCOPIC - Abnormal; Notable for the following components:   Bacteria, UA RARE (*)    All other components within normal limits  COMPREHENSIVE METABOLIC PANEL - Abnormal; Notable for the following components:   Potassium 3.4 (*)    Calcium 8.7 (*)    AST 13 (*)    All other components within normal limits  CBC WITH DIFFERENTIAL/PLATELET - Abnormal; Notable for the following components:   Hemoglobin 10.8 (*)    HCT 35.0 (*)    MCH 25.0 (*)    RDW 16.6 (*)    All other components within normal limits  LIPASE, BLOOD  CBC WITH DIFFERENTIAL/PLATELET  I-STAT BETA HCG BLOOD, ED ([facility], [facility], [facility] ONLY)  I-STAT BETA HCG BLOOD, ED ([facility], [facility], [facility] ONLY)  GC/CHLAMYDIA PROBE AMP ([place]) NOT AT [facility]    EKG None  Radiology US Transvaginal Non-OB  Result Date: 09/28/2020 CLINICAL DATA:  Pelvic pain. EXAM: TRANSABDOMINAL AND TRANSVAGINAL ULTRASOUND OF PELVIS DOPPLER ULTRASOUND OF OVARIES TECHNIQUE: Both transabdominal and transvaginal ultrasound examinations of the pelvis were performed. Transabdominal technique was performed for global imaging of the pelvis including uterus, ovaries, adnexal regions, and pelvic cul-de-sac. It was necessary to proceed with endovaginal exam following the transabdominal exam to visualize the uterus,  endometrium and ovaries/adnexa to better advantage. Color and duplex Doppler ultrasound was utilized to evaluate blood flow to the ovaries. COMPARISON:  None. FINDINGS: Uterus Measurements: 7.7 x 3.7 x 5.5 cm = volume: 83 mL. No fibroids or other mass visualized. Endometrium Thickness: 8 mm.  No focal abnormality visualized. Right ovary Measurements: 4.4 x 2.6 x 3.1 cm = volume: 18.4 mL. There is a complex cystic lesion arising from the right ovary measuring 1.8 x 1.6 x 1.9 cm. This has an echogenic ring surrounding a central cystic component, likely an involuting corpus luteum. Ovary otherwise unremarkable. No adnexal masses. Left ovary Measurements: 2.9 x 1.8 x 2.4 cm = volume: 6.5 mL. Normal appearance/no adnexal mass. Pulsed Doppler evaluation of both ovaries demonstrates normal low-resistance arterial and venous waveforms. Other findings No abnormal free fluid. IMPRESSION: 1. Complex cystic lesion measuring 1.9 cm arises from the right ovary, most likely an involuting corpus luteum versus a small hemorrhagic cyst. If there is any chance of pregnancy in this patient, a pregnancy test would be recommended. Otherwise, Short-interval follow up ultrasound in 6-12 weeks is recommended, preferably during the week following the patient's normal menses. 2. Exam otherwise within normal limits. Electronically Signed   By: Amie Portland M.D.   On: 09/28/2020 10:18   US Pelvis Complete  Result Date: 09/28/2020 CLINICAL DATA:  Pelvic pain. EXAM: TRANSABDOMINAL AND TRANSVAGINAL ULTRASOUND OF PELVIS DOPPLER ULTRASOUND OF OVARIES TECHNIQUE: Both transabdominal and transvaginal ultrasound examinations of the pelvis were performed. Transabdominal technique was performed for global imaging of the pelvis including uterus, ovaries, adnexal regions, and pelvic cul-de-sac. It was necessary to proceed with endovaginal exam following the transabdominal exam to visualize the uterus, endometrium and ovaries/adnexa to better  advantage. Color and duplex Doppler ultrasound was utilized to evaluate blood flow to the ovaries. COMPARISON:  None. FINDINGS: Uterus Measurements: 7.7 x 3.7 x 5.5 cm = volume: 83 mL. No fibroids or other mass visualized. Endometrium Thickness: 8 mm.  No focal abnormality visualized. Right ovary Measurements: 4.4 x 2.6 x 3.1 cm = volume: 18.4 mL. There is a complex cystic lesion arising from the right ovary measuring 1.8 x 1.6 x 1.9 cm. This has an echogenic ring surrounding  a central cystic component, likely an involuting corpus luteum. Ovary otherwise unremarkable. No adnexal masses. Left ovary Measurements: 2.9 x 1.8 x 2.4 cm = volume: 6.5 mL. Normal appearance/no adnexal mass. Pulsed Doppler evaluation of both ovaries demonstrates normal low-resistance arterial and venous waveforms. Other findings No abnormal free fluid. IMPRESSION: 1. Complex cystic lesion measuring 1.9 cm arises from the right ovary, most likely an involuting corpus luteum versus a small hemorrhagic cyst. If there is any chance of pregnancy in this patient, a pregnancy test would be recommended. Otherwise, Short-interval follow up ultrasound in 6-12 weeks is recommended, preferably during the week following the patient's normal menses. 2. Exam otherwise within normal limits. Electronically Signed   By: Amie Portland M.D.   On: 09/28/2020 10:18   [place] Art/Ven Flow Abd Pelv Doppler  Result Date: 09/28/2020 CLINICAL DATA:  Pelvic pain. EXAM: TRANSABDOMINAL AND TRANSVAGINAL ULTRASOUND OF PELVIS DOPPLER ULTRASOUND OF OVARIES TECHNIQUE: Both transabdominal and transvaginal ultrasound examinations of the pelvis were performed. Transabdominal technique was performed for global imaging of the pelvis including uterus, ovaries, adnexal regions, and pelvic cul-de-sac. It was necessary to proceed with endovaginal exam following the transabdominal exam to visualize the uterus, endometrium and ovaries/adnexa to better advantage. Color and duplex Doppler  ultrasound was utilized to evaluate blood flow to the ovaries. COMPARISON:  None. FINDINGS: Uterus Measurements: 7.7 x 3.7 x 5.5 cm = volume: 83 mL. No fibroids or other mass visualized. Endometrium Thickness: 8 mm.  No focal abnormality visualized. Right ovary Measurements: 4.4 x 2.6 x 3.1 cm = volume: 18.4 mL. There is a complex cystic lesion arising from the right ovary measuring 1.8 x 1.6 x 1.9 cm. This has an echogenic ring surrounding a central cystic component, likely an involuting corpus luteum. Ovary otherwise unremarkable. No adnexal masses. Left ovary Measurements: 2.9 x 1.8 x 2.4 cm = volume: 6.5 mL. Normal appearance/no adnexal mass. Pulsed Doppler evaluation of both ovaries demonstrates normal low-resistance arterial and venous waveforms. Other findings No abnormal free fluid. IMPRESSION: 1. Complex cystic lesion measuring 1.9 cm arises from the right ovary, most likely an involuting corpus luteum versus a small hemorrhagic cyst. If there is any chance of pregnancy in this patient, a pregnancy test would be recommended. Otherwise, Short-interval follow up ultrasound in 6-12 weeks is recommended, preferably during the week following the patient's normal menses. 2. Exam otherwise within normal limits. Electronically Signed   By: Amie Portland M.D.   On: 09/28/2020 10:18    Procedures Procedures (including critical care time)  Medications Ordered in ED Medications  ibuprofen (ADVIL) tablet 800 mg (800 mg Oral Given 09/28/20 0850)  cefTRIAXone (ROCEPHIN) injection 500 mg (500 mg Intramuscular Given 09/28/20 1108)  lidocaine (XYLOCAINE) 1 % (with pres) injection (1 mL  Given 09/28/20 1109)    ED Course  I have reviewed the triage vital signs and the nursing notes.  Pertinent labs & imaging results that were available during my care of the patient were reviewed by me and considered in my medical decision making (see chart for details).    MDM Rules/Calculators/A&P                           Jenny Schwartz is a 24 y.o. female with no pertinent past medical history that presents the emergency department today for abdominal pain/pelvic pain and vaginal spotting.  Tenderness to suprapubic region without guarding, pelvic exam with moderate tenderness to adnexa bilaterally and cervical motion tenderness, I  am concerned about PID at this time, however differential diagnoses considered include PID, TOA, UTI, cramping, MSK.   Initial interventions include Ibuprofen.   Labs demonstrated normal CMP with stable hemoglobin., unremarkable CBC.  Normal lipase.  Urinalysis clean.  Wet prep with clue cells.  Pelvic ultrasound with complex cystic lesion, they do recommend follow-up within 6 weeks.  Did express this to patient, patient will follow up with GYN.   Given the above findings, my suspicion is that pt may have PID based on pelvic exam, shared deicison making, will treat at this time.  Patient follow-up with GYN in the next couple of days.  Patient to be discharged at this time, states that she is ready to leave.  Patient states that pain does feel better with ibuprofen.  Symptomatic treatment discussed.  PID treatment will also cover BV.  Doubt need for further emergent work up at this time. I explained the diagnosis and have given explicit precautions to return to the ER including for any other new or worsening symptoms. The patient understands and accepts the medical plan as it's been dictated and I have answered their questions. Discharge instructions concerning home care and prescriptions have been given. The patient is STABLE and is discharged to home in good condition.   Final Clinical Impression(s) / ED Diagnoses Final diagnoses:  Pelvic pain    Rx / DC Orders ED Discharge Orders         Ordered    doxycycline (VIBRAMYCIN) 100 MG capsule  2 times daily        09/28/20 1104    metroNIDAZOLE (FLAGYL) 500 MG tablet  2 times daily        09/28/20 1104           Farrel Gordon,  PA-C 09/28/20 1348    Benjiman Core, MD 09/28/20 [phone]

## 2020-09-28 NOTE — ED Notes (Signed)
[place] complete.  This nurse was present for [place].

## 2020-09-28 NOTE — ED Notes (Signed)
Patient aware we need a urine sample, doesn't feel she can go at this time.

## 2020-09-28 NOTE — ED Triage Notes (Signed)
Pt complains of lower abdominal/pelic pain x 3 days. She had light pink vaginal bleeding today. LMP was 1.5 weeks ago. No urinary symptoms. She is not taking birth control, has been off depot shot for almost 2 years.

## 2020-09-28 NOTE — Discharge Instructions (Signed)
Your work-up today was reassuring, I want you to follow-up with a GYN doctor for repeat ultrasound as we discussed, you can go to the women's outpatient clinic.  I am also treating you for PID since you did have tenderness when I did the pelvic exam, pick up the 2 medications as we discussed.  You can take Tylenol directed on the bottle for pain.  In regards to taking the metronidazole, this will also cover for BV.  Please do not drink alcohol when taking this medication.  If you have any new or worsening concerning symptoms please come back to the emergency department.  Please speak to your pharmacist today for any new medications prescribed or interactions to medications. Your STD results will be on my chart in 2 days.  Please refrain from intercourse for the next 2 weeks.

## 2020-09-29 LAB — GC/CHLAMYDIA PROBE AMP (~~LOC~~) NOT AT ARMC
Chlamydia: NEGATIVE
Comment: NEGATIVE
Comment: NORMAL
Neisseria Gonorrhea: NEGATIVE

## 2021-01-18 ENCOUNTER — Telehealth: Payer: Medicaid - Out of State | Admitting: Nurse Practitioner

## 2021-01-18 DIAGNOSIS — J069 Acute upper respiratory infection, unspecified: Secondary | ICD-10-CM

## 2021-01-18 NOTE — Progress Notes (Signed)
We are sorry you are not feeling well.  Here is how we plan to help!  Based on what you have shared with me, it looks like you may have a viral upper respiratory infection.  Upper respiratory infections are caused by a large number of viruses; however, rhinovirus is the most common cause.   Symptoms vary from person to person, with common symptoms including sore throat, cough, fatigue or lack of energy and feeling of general discomfort.  A low-grade fever of up to 100.4 may present, but is often uncommon.  Symptoms vary however, and are closely related to a person's age or underlying illnesses.  The most common symptoms associated with an upper respiratory infection are nasal discharge or congestion, cough, sneezing, headache and pressure in the ears and face.  These symptoms usually persist for about 3 to 10 days, but can last up to 2 weeks.  It is important to know that upper respiratory infections do not cause serious illness or complications in most cases.    Upper respiratory infections can be transmitted from person to person, with the most common method of transmission being a person's hands.  The virus is able to live on the skin and can infect other persons for up to 2 hours after direct contact.  Also, these can be transmitted when someone coughs or sneezes; thus, it is important to cover the mouth to reduce this risk.  To keep the spread of the illness at bay, good hand hygiene is very important.  This is an infection that is most likely caused by a virus. There are no specific treatments other than to help you with the symptoms until the infection runs its course.  We are sorry you are not feeling well.  Here is how we plan to help!   For nasal congestion, you may use an oral decongestants such as Mucinex D or if you have glaucoma or high blood pressure use plain Mucinex.  Saline nasal spray or nasal drops can help and can safely be used as often as needed for congestion.  For your congestion,  I have prescribed Fluticasone nasal spray one spray in each nostril twice a day  If you do not have a history of heart disease, hypertension, diabetes or thyroid disease, prostate/bladder issues or glaucoma, you may also use Sudafed to treat nasal congestion.  It is highly recommended that you consult with a pharmacist or your primary care physician to ensure this medication is safe for you to take.     If you have a cough, you may use cough suppressants such as Delsym and Robitussin.  If you have glaucoma or high blood pressure, you can also use Coricidin HBP.    If you have a sore or scratchy throat, use a saltwater gargle-  to  teaspoon of salt dissolved in a 4-ounce to 8-ounce glass of warm water.  Gargle the solution for approximately 15-30 seconds and then spit.  It is important not to swallow the solution.  You can also use throat lozenges/cough drops and Chloraseptic spray to help with throat pain or discomfort.  Warm or cold liquids can also be helpful in relieving throat pain.  For headache, pain or general discomfort, you can use Ibuprofen or Tylenol as directed.   Some authorities believe that zinc sprays or the use of Echinacea may shorten the course of your symptoms.   HOME CARE . Only take medications as instructed by your medical team. . Be sure to drink plenty   of fluids. Water is fine as well as fruit juices, sodas and electrolyte beverages. You may want to stay away from caffeine or alcohol. If you are nauseated, try taking small sips of liquids. How do you know if you are getting enough fluid? Your urine should be a pale yellow or almost colorless. . Get rest. . Taking a steamy shower or using a humidifier may help nasal congestion and ease sore throat pain. You can place a towel over your head and breathe in the steam from hot water coming from a faucet. . Using a saline nasal spray works much the same way. . Cough drops, hard candies and sore throat lozenges may ease your  cough. . Avoid close contacts especially the very young and the elderly . Cover your mouth if you cough or sneeze . Always remember to wash your hands.   GET HELP RIGHT AWAY IF: . You develop worsening fever. . If your symptoms do not improve within 10 days . You develop yellow or green discharge from your nose over 3 days. . You have coughing fits . You develop a severe head ache or visual changes. . You develop shortness of breath, difficulty breathing or start having chest pain . Your symptoms persist after you have completed your treatment plan  MAKE SURE YOU   Understand these instructions.  Will watch your condition.  Will get help right away if you are not doing well or get worse.  Your e-visit answers were reviewed by a board certified advanced clinical practitioner to complete your personal care plan. Depending upon the condition, your plan could have included both over the counter or prescription medications. Please review your pharmacy choice. If there is a problem, you may call our nursing hot line at and have the prescription routed to another pharmacy. Your safety is important to us. If you have drug allergies check your prescription carefully.   You can use MyChart to ask questions about today's visit, request a non-urgent call back, or ask for a work or school excuse for 24 hours related to this e-Visit. If it has been greater than 24 hours you will need to follow up with your provider, or enter a new e-Visit to address those concerns. You will get an e-mail in the next two days asking about your experience.  I hope that your e-visit has been valuable and will speed your recovery. Thank you for using e-visits.   5-10 minutes spent reviewing and documenting in chart.    

## 2021-05-08 ENCOUNTER — Telehealth: Payer: Medicaid - Out of State | Admitting: Family

## 2021-05-08 ENCOUNTER — Encounter: Payer: Self-pay | Admitting: Physician Assistant

## 2021-05-08 ENCOUNTER — Telehealth: Payer: Medicaid - Out of State | Admitting: Physician Assistant

## 2021-05-08 DIAGNOSIS — S99929A Unspecified injury of unspecified foot, initial encounter: Secondary | ICD-10-CM

## 2021-05-08 DIAGNOSIS — W19XXXA Unspecified fall, initial encounter: Secondary | ICD-10-CM

## 2021-05-08 DIAGNOSIS — M79673 Pain in unspecified foot: Secondary | ICD-10-CM

## 2021-05-08 NOTE — Patient Instructions (Signed)
  Jenny Schwartz, thank you for joining Margaretann Loveless, PA-C for today's virtual visit.  While this provider is not your primary care provider (PCP), if your PCP is located in our provider database this encounter information will be shared with them immediately following your visit.  Consent: (Patient) Jenny Schwartz provided verbal consent for this virtual visit at the beginning of the encounter.  Current Medications:  Current Outpatient Medications:    HYDROcodone-acetaminophen (NORCO/VICODIN) 5-325 MG tablet, Take 1 tablet by mouth every 6 (six) hours as needed for severe pain. (Patient not taking: Reported on 03/22/2020), Disp: 6 tablet, Rfl: 0   ibuprofen (ADVIL) 200 MG tablet, Take 400 mg by mouth every 6 (six) hours as needed., Disp: , Rfl:    naproxen (NAPROSYN) 250 MG tablet, Take 1 tablet (250 mg total) by mouth 2 (two) times daily with a meal. (Patient not taking: Reported on 03/22/2020), Disp: 30 tablet, Rfl: 0   Olopatadine HCl 0.2 % SOLN, 2 gtts in affected eye tid prn for itchiness. (Patient not taking: Reported on 03/19/2017), Disp: 2.5 mL, Rfl: 0   ondansetron (ZOFRAN ODT) 4 MG disintegrating tablet, 4mg  ODT q4 hours prn nausea/vomit (Patient not taking: Reported on 03/22/2020), Disp: 10 tablet, Rfl: 0   traMADol (ULTRAM) 50 MG tablet, Take 1 tablet (50 mg total) by mouth every 6 (six) hours as needed. (Patient not taking: Reported on 03/22/2020), Disp: 10 tablet, Rfl: 0   Medications ordered in this encounter:  No orders of the defined types were placed in this encounter.    *If you need refills on other medications prior to your next appointment, please contact your pharmacy*  Follow-Up: Call back or seek an in-person evaluation if the symptoms worsen or if the condition fails to improve as anticipated.   If you have been instructed to have an in-person evaluation today at a local Urgent Care facility, please use the link below. It will take you to a list of all of our available  [facility], including address, phone number and hours of operation. Please do not delay care.  [facility]  If you or a family member do not have a primary care provider, use the link below to schedule a visit and establish care. When you choose a [facility] primary care physician or advanced practice provider, you gain a long-term partner in health. Find a Primary Care Provider  Learn more about [facility]'s in-office and virtual care options: [facility] - Get Care Now

## 2021-05-08 NOTE — Progress Notes (Signed)
Based on what you shared with me, I feel your condition warrants further evaluation and I recommend that you be seen in a face to face visit.   Given your injury you need to be seen in person to evaluate your foot as it may need x-rays.   NOTE: There will be NO CHARGE for this eVisit   If you are having a true medical emergency please call 911.      For an urgent face to face visit, [facility] has six urgent care centers for your convenience:     [facility] at [facility] Directions [phone] [address] [place], [place] [address]    [facility] Health Urgent Care Center [facility]) Get Driving Directions [phone] [address] [place], [place] [address]  [facility] Health Urgent Care Center [facility]) Get Driving Directions [phone] [address] [place],  [place]  [address]  [facility] Health Urgent Care at [facility] Get Driving Directions [phone] [address] [place], [place] [address]   [facility] Health Urgent Care at [facility] Get Driving Directions  [phone] [address] [place], [place] [address]   [facility] Health Urgent Care at [facility] Directions [phone] [address] [place], [place] [address]  Your MyChart E-visit questionnaire answers were reviewed by a board certified advanced clinical practitioner to complete your personal care plan based on your specific symptoms.  Thank you for using e-Visits.   Approximately 5 minutes was spent documenting and reviewing patient's chart.

## 2021-05-08 NOTE — Progress Notes (Signed)
[name], Jenny Schwartz are scheduled for a virtual visit with your provider today.    Just as we do with appointments in the office, we must obtain your consent to participate.  Your consent will be active for this visit and any virtual visit you may have with one of our providers in the next 365 days.    If you have a MyChart account, I can also send a copy of this consent to you electronically.  All virtual visits are billed to your insurance company just like a traditional visit in the office.  As this is a virtual visit, video technology does not allow for your provider to perform a traditional examination.  This may limit your provider's ability to fully assess your condition.  If your provider identifies any concerns that need to be evaluated in person or the need to arrange testing such as labs, EKG, etc, we will make arrangements to do so.    Although advances in technology are sophisticated, we cannot ensure that it will always work on either your end or our end.  If the connection with a video visit is poor, we may have to switch to a telephone visit.  With either a video or telephone visit, we are not always able to ensure that we have a secure connection.   I need to obtain your verbal consent now.   Are you willing to proceed with your visit today?   Tomiko Schoon has provided verbal consent on 05/08/2021 for a virtual visit (video or telephone).   Margaretann Loveless, PA-C 05/08/2021  1:09 PM  Virtual Visit Consent   Jenny Schwartz, you are scheduled for a virtual visit with a [facility] provider today.     Just as with appointments in the office, your consent must be obtained to participate.  Your consent will be active for this visit and any virtual visit you may have with one of our providers in the next 365 days.     If you have a MyChart account, a copy of this consent can be sent to you electronically.  All virtual visits are billed to your insurance company just like a traditional visit in  the office.    As this is a virtual visit, video technology does not allow for your provider to perform a traditional examination.  This may limit your provider's ability to fully assess your condition.  If your provider identifies any concerns that need to be evaluated in person or the need to arrange testing (such as labs, EKG, etc.), we will make arrangements to do so.     Although advances in technology are sophisticated, we cannot ensure that it will always work on either your end or our end.  If the connection with a video visit is poor, the visit may have to be switched to a telephone visit.  With either a video or telephone visit, we are not always able to ensure that we have a secure connection.     I need to obtain your verbal consent now.   Are you willing to proceed with your visit today?    Jenny Schwartz has provided verbal consent on 05/08/2021 for a virtual visit (video or telephone).   Margaretann Loveless, PA-C   Date: 05/08/2021 1:09 PM   Virtual Visit via Video Note   Jenny Schwartz, connected [email or web address]@ ([id number], 10/30/1996) on 05/08/21 at 12:00 PM EDT by a video-enabled telemedicine application and verified that I am speaking with the correct  person using two identifiers.  Location: Patient: Virtual Visit Location Patient: Home Provider: Virtual Visit Location Provider: Home Office   I discussed the limitations of evaluation and management by telemedicine and the availability of in person appointments. The patient expressed understanding and agreed to proceed.    History of Present Illness: Jenny Schwartz is a 25 y.o. who identifies as a female who was assigned female at birth, and is being seen today for foot pain. She reports she had hurt her foot Friday evening by tripping and dropping groceries on the pinky toe of the foot. She has attached a photo in media of how it looked Friday. Today pain and swelling have all improved. She is needing a work note to clear her  back to work.   HPI: HPI  Problems: There are no problems to display for this patient.   Allergies: No Known Allergies Medications:  Current Outpatient Medications:    HYDROcodone-acetaminophen (NORCO/VICODIN) 5-325 MG tablet, Take 1 tablet by mouth every 6 (six) hours as needed for severe pain. (Patient not taking: Reported on 03/22/2020), Disp: 6 tablet, Rfl: 0   ibuprofen (ADVIL) 200 MG tablet, Take 400 mg by mouth every 6 (six) hours as needed., Disp: , Rfl:    naproxen (NAPROSYN) 250 MG tablet, Take 1 tablet (250 mg total) by mouth 2 (two) times daily with a meal. (Patient not taking: Reported on 03/22/2020), Disp: 30 tablet, Rfl: 0   Olopatadine HCl 0.2 % SOLN, 2 gtts in affected eye tid prn for itchiness. (Patient not taking: Reported on 03/19/2017), Disp: 2.5 mL, Rfl: 0   ondansetron (ZOFRAN ODT) 4 MG disintegrating tablet, 4mg  ODT q4 hours prn nausea/vomit (Patient not taking: Reported on 03/22/2020), Disp: 10 tablet, Rfl: 0   traMADol (ULTRAM) 50 MG tablet, Take 1 tablet (50 mg total) by mouth every 6 (six) hours as needed. (Patient not taking: Reported on 03/22/2020), Disp: 10 tablet, Rfl: 0  Observations/Objective: Patient is well-developed, well-nourished in no acute distress.  Resting comfortably at home.  Head is normocephalic, atraumatic.  No labored breathing.  Speech is clear and coherent with logical content.  Patient is alert and oriented at baseline.  Pinky toe on foot is not swollen, no deformity, no discoloration. Much improved compared to picture on file  Assessment and Plan: 1. Foot injury, unspecified laterality, initial encounter - Much improved with conservative management - Work not provided to release back to work  Follow Up Instructions: I discussed the assessment and treatment plan with the patient. The patient was provided an opportunity to ask questions and all were answered. The patient agreed with the plan and demonstrated an understanding of the  instructions.  A copy of instructions were sent to the patient via MyChart.  The patient was advised to call back or seek an in-person evaluation if the symptoms worsen or if the condition fails to improve as anticipated.  Time:  I spent 10 minutes with the patient via telehealth technology discussing the above problems/concerns.    05/22/2020, PA-C

## 2021-11-12 NOTE — L&D Delivery Note (Signed)
Delivery Note Pt progressed from 4cm to complete and +2 in one hour. She had a small forebag that was ruptured just prior to delivery with meconium noted.   At 7:47 AM a healthy female was delivered via Vaginal, Spontaneous (Presentation:   Occiput Anterior).  APGAR: 9, 9; weight  pending.   Placenta status: Spontaneous, Intact.  Cord: 3 vessels with the following complications: None.    Anesthesia: Epidural Episiotomy: None Lacerations: none  Suture Repair:  n/a Est. Blood Loss (mL):   Mom to postpartum.  Baby to Couplet care / Skin to Skin.  Oliver Pila 10/29/2022, 8:03 AM

## 2021-11-20 ENCOUNTER — Telehealth: Payer: Medicaid Other | Admitting: Emergency Medicine

## 2021-11-20 DIAGNOSIS — K529 Noninfective gastroenteritis and colitis, unspecified: Secondary | ICD-10-CM

## 2021-11-20 MED ORDER — ONDANSETRON HCL 4 MG PO TABS: 4.0000 mg | ORAL_TABLET | Freq: Three times a day (TID) | ORAL | 0 refills | Status: DC | PRN

## 2021-11-20 NOTE — Progress Notes (Signed)
E-Visit for Vomiting  We are sorry that you are not feeling well. Here is how we plan to help!  Based on what you have shared with me it looks like you have a Virus that is irritating your GI tract.  Vomiting is the forceful emptying of a portion of the stomach's content through the mouth.  Although nausea and vomiting can make you feel miserable, it's important to remember that these are not diseases, but rather symptoms of an underlying illness.  When we treat short term symptoms, we always caution that any symptoms that persist should be fully evaluated in a medical office.  I have prescribed a medication that will help alleviate your symptoms and allow you to stay hydrated:  Zofran 4 mg 1 tablet every 8 hours as needed for nausea and vomiting  I recommend Imodium (over the counter) for diarrhea.   HOME CARE: Drink clear liquids.  This is very important! Dehydration (the lack of fluid) can lead to a serious complication.  Start off with 1 tablespoon every 5 minutes for 8 hours. You may begin eating bland foods after 8 hours without vomiting.  Start with saltine crackers, white bread, rice, mashed potatoes, applesauce. After 48 hours on a bland diet, you may resume a normal diet. Try to go to sleep.  Sleep often empties the stomach and relieves the need to vomit.  GET HELP RIGHT AWAY IF:  Your symptoms do not improve or worsen within 2 days after treatment. You have a fever for over 3 days. You cannot keep down fluids after trying the medication.  MAKE SURE YOU:  Understand these instructions. Will watch your condition. Will get help right away if you are not doing well or get worse.   Thank you for choosing an e-visit.  Your e-visit answers were reviewed by a board certified advanced clinical practitioner to complete your personal care plan. Depending upon the condition, your plan could have included both over the counter or prescription medications.  Please review your pharmacy  choice. Make sure the pharmacy is open so you can pick up prescription now. If there is a problem, you may contact your provider through [employer] and have the prescription routed to another pharmacy.  Your safety is important to [place]. If you have drug allergies check your prescription carefully.   For the next 24 hours you can use MyChart to ask questions about today's visit, request a non-urgent call back, or ask for a work or school excuse. You will get an email in the next two days asking about your experience. I hope that your e-visit has been valuable and will speed your recovery.  I have spent 5 minutes in review of e-visit questionnaire, review and updating patient chart, medical decision making and response to patient.   Rica Mast, PhD, FNP-BC

## 2022-01-19 ENCOUNTER — Telehealth: Payer: Medicaid Other | Admitting: Family Medicine

## 2022-01-19 DIAGNOSIS — N898 Other specified noninflammatory disorders of vagina: Secondary | ICD-10-CM

## 2022-01-19 NOTE — Progress Notes (Signed)
[place]  ? ?Wants help with getting pregnant and possibly be pregnant. ? ?Message sent for in person appt.  ?

## 2022-02-22 ENCOUNTER — Encounter (HOSPITAL_COMMUNITY): Payer: Self-pay

## 2022-02-22 ENCOUNTER — Other Ambulatory Visit: Payer: Self-pay

## 2022-02-22 ENCOUNTER — Emergency Department (HOSPITAL_COMMUNITY)
Admission: EM | Admit: 2022-02-22 | Discharge: 2022-02-22 | Disposition: A | Discharge status: Home / Self Care | Payer: Medicaid Other | Attending: Emergency Medicine | Admitting: Emergency Medicine

## 2022-02-22 DIAGNOSIS — O26891 Other specified pregnancy related conditions, first trimester: Secondary | ICD-10-CM | POA: Insufficient documentation

## 2022-02-22 DIAGNOSIS — Z3A01 Less than 8 weeks gestation of pregnancy: Secondary | ICD-10-CM | POA: Insufficient documentation

## 2022-02-22 DIAGNOSIS — R109 Unspecified abdominal pain: Secondary | ICD-10-CM | POA: Diagnosis not present

## 2022-02-22 LAB — URINALYSIS, ROUTINE W REFLEX MICROSCOPIC
Bilirubin Urine: NEGATIVE
Glucose, UA: NEGATIVE mg/dL
Hgb urine dipstick: NEGATIVE
Ketones, ur: NEGATIVE mg/dL
Leukocytes,Ua: NEGATIVE
Nitrite: NEGATIVE
Protein, ur: NEGATIVE mg/dL
Specific Gravity, Urine: 1.024 (ref 1.005–1.030)
pH: 5 (ref 5.0–8.0)

## 2022-02-22 LAB — HCG, QUANTITATIVE, PREGNANCY: hCG, Beta Chain, Quant, S: 2139 m[IU]/mL — ABNORMAL HIGH (ref <5)

## 2022-02-22 NOTE — ED Triage Notes (Signed)
Pt presents with c/o abdominal cramping for several days. Pt also reports she recently took several pregnancy tests which were all positive.  ?

## 2022-02-22 NOTE — ED Provider Notes (Signed)
?[facility] DEPT ?Provider Note ? ? ?CSN: [id number] ?Arrival date & time: 02/22/22  [id number] ? ?  ? ?History ? ?Chief Complaint  ?Patient presents with  ? Abdominal Cramping  ? ? ?Jenny Schwartz is a 26 y.o. female with no significant past medical history, previous term pregnancies without complications who presents with concern for some light abdominal cramping that she rates as 3/10, positive pregnancy test, without vaginal bleeding.  Patient reports that her last menstrual period was 3/14.  Patient denies any vaginal discharge, pain with sex, dysuria, hematuria.  She reports she has been sexually active.  No previous history of ectopic pregnancies. ? ? ?Abdominal Cramping ?Associated symptoms include abdominal pain.  ? ?  ? ?Home Medications ?Prior to Admission medications   ?Medication Sig Start Date End Date Taking? Authorizing Provider  ?HYDROcodone-acetaminophen (NORCO/VICODIN) 5-325 MG tablet Take 1 tablet by mouth every 6 (six) hours as needed for severe pain. ?Patient not taking: Reported on 03/22/2020 01/12/19   Barrie Folk, PA-C  ?ibuprofen (ADVIL) 200 MG tablet Take 400 mg by mouth every 6 (six) hours as needed.    [provider]  ?naproxen (NAPROSYN) 250 MG tablet Take 1 tablet (250 mg total) by mouth 2 (two) times daily with a meal. ?Patient not taking: Reported on 03/22/2020 03/24/17   Waynetta Pean, PA-C  ?Olopatadine HCl 0.2 % SOLN 2 gtts in affected eye tid prn for itchiness. ?Patient not taking: Reported on 03/19/2017 02/27/12   Moreno-Coll, Adlih, MD  ?ondansetron (ZOFRAN) 4 MG tablet Take 1 tablet (4 mg total) by mouth every 8 (eight) hours as needed for nausea or vomiting. 11/20/21   Carvel Getting, NP  ?traMADol (ULTRAM) 50 MG tablet Take 1 tablet (50 mg total) by mouth every 6 (six) hours as needed. ?Patient not taking: Reported on 03/22/2020 03/21/17   Milton Ferguson, MD  ?   ? ?Allergies    ?Patient has no known allergies.   ? ?Review of Systems   ?Review  of Systems  ?Gastrointestinal:  Positive for abdominal pain.  ?All other systems reviewed and are negative. ? ?Physical Exam ?Updated Vital Signs ?BP 120/82 (BP Location: Left Arm)   Pulse 85   Temp 98.5 ?F (36.9 ?C) (Oral)   Resp 17   LMP 01/23/2022 (Approximate)   SpO2 100%  ?Physical Exam ?Vitals and nursing note reviewed.  ?Constitutional:   ?   General: She is not in acute distress. ?   Appearance: Normal appearance.  ?HENT:  ?   Head: Normocephalic and atraumatic.  ?Eyes:  ?   General:     ?   Right eye: No discharge.     ?   Left eye: No discharge.  ?Cardiovascular:  ?   Rate and Rhythm: Normal rate and regular rhythm.  ?   Heart sounds: No murmur heard. ?  No friction rub. No gallop.  ?Pulmonary:  ?   Effort: Pulmonary effort is normal.  ?   Breath sounds: Normal breath sounds.  ?Abdominal:  ?   General: Bowel sounds are normal.  ?   Palpations: Abdomen is soft.  ?   Comments: No palpable uterine fundus, no significant tenderness to palpation, no rebound, rigidity, guarding.  ?Skin: ?   General: Skin is warm and dry.  ?   Capillary Refill: Capillary refill takes less than 2 seconds.  ?Neurological:  ?   Mental Status: She is alert and oriented to person, place, and time.  ?Psychiatric:     ?  Mood and Affect: Mood normal.     ?   Behavior: Behavior normal.  ? ? ?ED Results / Procedures / Treatments   ?Labs ?(all labs ordered are listed, but only abnormal results are displayed) ?Labs Reviewed  ?HCG, QUANTITATIVE, PREGNANCY - Abnormal; Notable for the following components:  ?    Result Value  ? hCG, Beta Chain, Quant, S 2,139 (*)   ? All other components within normal limits  ?URINALYSIS, ROUTINE W REFLEX MICROSCOPIC - Abnormal; Notable for the following components:  ? APPearance HAZY (*)   ? All other components within normal limits  ? ? ?EKG ?None ? ?Radiology ?No results found. ? ?Procedures ?Procedures  ? ? ?Medications Ordered in ED ?Medications - No data to display ? ?ED Course/ Medical Decision  Making/ A&P ?  ?                        ?Medical Decision Making ?Amount and/or Complexity of Data Reviewed ?Labs: ordered. ? ? ?Is an overall well-appearing 26 year old female who presents with concern for mild abdominal cramping, positive home pregnancy test.  Emergent differential diagnosis includes ectopic pregnancy, versus other acute OB/GYN abnormality.  This is not an exhaustive differential. On physical exam I am suspicious of normal routine pregnancy of 3 to 4 weeks without complications.  We will perform urinalysis to assess for UTI or other GU complication that may mimic early pregnancy, as well as obtain quantitative hCG to assess for how far along patient has. ? ?I personally reviewed urinalysis which shows no evidence of UTI.  Quantitative hCG is consistent at 2139 with 3 to 4 weeks pregnancy.  Patient approximately [redacted] weeks gestational age based on last menstrual period.  Discussed with patient do not have any additional concerns today, encouraged her to continue taking prenatal vitamin, follow-up with OB/GYN for confirmatory ultrasound at 6 to 7 weeks.  Patient understands agrees plan, discharged in stable condition at this time.  Extensive return precautions given for worsening abdominal pain, vaginal bleeding.   ?Final Clinical Impression(s) / ED Diagnoses ?Final diagnoses:  ?Less than [redacted] weeks gestation of pregnancy  ?Abdominal cramping  ? ? ?Rx / DC Orders ?ED Discharge Orders   ? ? None  ? ?  ? ? ?  ?Anselmo Pickler, PA-C ?02/22/22 1238 ? ?  ?Hayden Rasmussen, MD ?02/22/22 1751 ? ?

## 2022-02-22 NOTE — ED Provider Triage Note (Signed)
Emergency Medicine Provider Triage Evaluation Note ? ?Jenny Schwartz , a 26 y.o. female  was evaluated in triage.  Pt complains of some abdominal cramping, some nausea without vomiting.  Patient with multiple home pregnancy test.  Her last menstrual period was 3/14.  She is working on getting into contact with an OB/GYN at this time.  She denies any previous complications to pregnancies. She denies vaginal bleeding at this time. ? ?Review of Systems  ?Positive: Abdominal cramping ?Negative: Chest pain, shortness of breath ? ?Physical Exam  ?BP 115/89 (BP Location: Left Arm)   Pulse 87   Temp 98.5 ?F (36.9 ?C) (Oral)   Resp 18   LMP 01/23/2022 (Approximate)   SpO2 97%  ?Gen:   Awake, no distress  ?Resp:  Normal effort  ?MSK:   Moves extremities without difficulty  ?Other:  Very minimal tenderness to palpation suprapubic region, no palpable uterine fundus, no rebound, rigidity, guarding ? ?Medical Decision Making  ?Medically screening exam initiated at 9:51 AM.  Appropriate orders placed.  Jenny Schwartz was informed that the remainder of the evaluation will be completed by another provider, this initial triage assessment does not replace that evaluation, and the importance of remaining in the ED until their evaluation is complete. ? ?Recommended she age, discussed with patient that we can likely discharge with plans for OB/GYN follow-up pending quantitative hCG ?  ?Olene Floss, PA-C ?02/22/22 8338 ? ?

## 2022-02-22 NOTE — Discharge Instructions (Signed)
If you have any complications including worsening abdominal pain, begin to have vaginal bleeding I recommend that you present to the MAU which is at [facility] not [facility] long as they have a specific maternal unit.  In the meantime your symptoms and presentation today are consistent with around a 4-week pregnancy as we discussed.  Your last menstrual period calculated due date would be around mid December.  Please begin taking prenatal vitamins, follow-up with OB/GYN as you have planned. ?

## 2022-02-24 ENCOUNTER — Other Ambulatory Visit: Payer: Self-pay

## 2022-02-24 ENCOUNTER — Inpatient Hospital Stay (HOSPITAL_COMMUNITY)
Admission: AD | Admit: 2022-02-24 | Discharge: 2022-02-24 | Disposition: A | Discharge status: Home / Self Care | Payer: Medicaid Other | Attending: Obstetrics and Gynecology | Admitting: Obstetrics and Gynecology

## 2022-02-24 ENCOUNTER — Encounter (HOSPITAL_COMMUNITY): Payer: Self-pay | Admitting: Obstetrics and Gynecology

## 2022-02-24 ENCOUNTER — Inpatient Hospital Stay (HOSPITAL_COMMUNITY): Payer: Medicaid Other

## 2022-02-24 DIAGNOSIS — N930 Postcoital and contact bleeding: Secondary | ICD-10-CM | POA: Insufficient documentation

## 2022-02-24 DIAGNOSIS — O26899 Other specified pregnancy related conditions, unspecified trimester: Secondary | ICD-10-CM

## 2022-02-24 DIAGNOSIS — O26891 Other specified pregnancy related conditions, first trimester: Secondary | ICD-10-CM | POA: Diagnosis not present

## 2022-02-24 DIAGNOSIS — O209 Hemorrhage in early pregnancy, unspecified: Secondary | ICD-10-CM | POA: Insufficient documentation

## 2022-02-24 DIAGNOSIS — R109 Unspecified abdominal pain: Secondary | ICD-10-CM

## 2022-02-24 DIAGNOSIS — Z3A01 Less than 8 weeks gestation of pregnancy: Secondary | ICD-10-CM | POA: Diagnosis not present

## 2022-02-24 LAB — URINALYSIS, ROUTINE W REFLEX MICROSCOPIC
Bilirubin Urine: NEGATIVE
Glucose, UA: NEGATIVE mg/dL
Hgb urine dipstick: NEGATIVE
Ketones, ur: NEGATIVE mg/dL
Leukocytes,Ua: NEGATIVE
Nitrite: NEGATIVE
Protein, ur: NEGATIVE mg/dL
Specific Gravity, Urine: 1.023 (ref 1.005–1.030)
pH: 6 (ref 5.0–8.0)

## 2022-02-24 LAB — CBC
HCT: 35 % — ABNORMAL LOW (ref 36.0–46.0)
Hemoglobin: 11.2 g/dL — ABNORMAL LOW (ref 12.0–15.0)
MCH: 25.7 pg — ABNORMAL LOW (ref 26.0–34.0)
MCHC: 32 g/dL (ref 30.0–36.0)
MCV: 80.3 fL (ref 80.0–100.0)
Platelets: 350 10*3/uL (ref 150–400)
RBC: 4.36 MIL/uL (ref 3.87–5.11)
RDW: 17.4 % — ABNORMAL HIGH (ref 11.5–15.5)
WBC: 7.9 10*3/uL (ref 4.0–10.5)
nRBC: 0 % (ref 0.0–0.2)

## 2022-02-24 LAB — WET PREP, GENITAL
Sperm: NONE SEEN
Trich, Wet Prep: NONE SEEN
WBC, Wet Prep HPF POC: >=10 — AB (ref <10)
Yeast Wet Prep HPF POC: NONE SEEN

## 2022-02-24 LAB — ABO/RH: ABO/RH(D): O POS

## 2022-02-24 LAB — HCG, QUANTITATIVE, PREGNANCY: hCG, Beta Chain, Quant, S: 4331 m[IU]/mL — ABNORMAL HIGH (ref <5)

## 2022-02-24 NOTE — MAU Note (Signed)
Pt reports to mau with c/o vag bleeding that started after having intercourse this morning.  Pt states she saw blood in toilet and some pink bleeding when she wiped.  Denies pain at this time. ?

## 2022-02-24 NOTE — MAU Provider Note (Signed)
?History  ?  ? ?[id number] ? ?Arrival date and time: 02/24/22 0825 ?  ? ?Chief Complaint  ?Patient presents with  ? Vaginal Bleeding  ? ? ? ?HPI ?Jenny Schwartz is a 26 y.o. at [redacted]w[redacted]d by LMP who presents for vaginal bleeding & cramping. ?Symptoms started this morning after intercourse. Reports pink/red spotting on toilet paper. Mild intermittent abdominal cramping that is "not as bad as a period". Vomited twice last night. No fever, nausea, vaginal discharge, or dysuria.   ?Has first appointment with [facility] scheduled in a few weeks. ? ?OB History   ? ? Gravida  ?2  ? Para  ?1  ? Term  ?1  ? Preterm  ?   ? AB  ?   ? Living  ?1  ?  ? ? SAB  ?   ? IAB  ?   ? Ectopic  ?   ? Multiple  ?   ? Live Births  ?1  ?   ?  ?  ? ? ?History reviewed. No pertinent past medical history. ? ?History reviewed. No pertinent surgical history. ? ?History reviewed. No pertinent family history. ? ?No Known Allergies ? ?No current facility-administered medications on file prior to encounter.  ? ?Current Outpatient Medications on File Prior to Encounter  ?Medication Sig Dispense Refill  ? HYDROcodone-acetaminophen (NORCO/VICODIN) 5-325 MG tablet Take 1 tablet by mouth every 6 (six) hours as needed for severe pain. (Patient not taking: Reported on 03/22/2020) 6 tablet 0  ? ibuprofen (ADVIL) 200 MG tablet Take 400 mg by mouth every 6 (six) hours as needed.    ? naproxen (NAPROSYN) 250 MG tablet Take 1 tablet (250 mg total) by mouth 2 (two) times daily with a meal. (Patient not taking: Reported on 03/22/2020) 30 tablet 0  ? Olopatadine HCl 0.2 % SOLN 2 gtts in affected eye tid prn for itchiness. (Patient not taking: Reported on 03/19/2017) 2.5 mL 0  ? ondansetron (ZOFRAN) 4 MG tablet Take 1 tablet (4 mg total) by mouth every 8 (eight) hours as needed for nausea or vomiting. 20 tablet 0  ? traMADol (ULTRAM) 50 MG tablet Take 1 tablet (50 mg total) by mouth every 6 (six) hours as needed. (Patient not taking: Reported on 03/22/2020) 10 tablet 0   ? ? ? ?ROS ?Pertinent positives and negative per HPI, all others reviewed and negative ? ?Physical Exam  ? ?BP 124/62   Pulse 74   Temp 98.7 ?F (37.1 ?C) (Oral)   Resp 16   Ht 5\' 9"  (1.753 m)   Wt 112.5 kg   LMP 01/23/2022 (Approximate)   SpO2 100%   BMI 36.64 kg/m?  ? ?Patient Vitals for the past 24 hrs: ? BP Temp Temp src Pulse Resp SpO2 Height Weight  ?02/24/22 0905 -- -- -- -- -- 100 % -- --  ?02/24/22 0903 124/62 -- -- 74 -- -- -- --  ?02/24/22 0843 120/62 98.7 ?F (37.1 ?C) Oral 82 16 97 % 5\' 9"  (1.753 m) 112.5 kg  ? ? ?Physical Exam ?Vitals and nursing note reviewed. Exam conducted with a chaperone present.  ?Constitutional:   ?   General: She is not in acute distress. ?   Appearance: Normal appearance.  ?HENT:  ?   Head: Normocephalic and atraumatic.  ?Eyes:  ?   Conjunctiva/sclera: Conjunctivae normal.  ?   Pupils: Pupils are equal, round, and reactive to light.  ?Pulmonary:  ?   Effort: Pulmonary effort is normal. No respiratory distress.  ?  Abdominal:  ?   General: There is no distension.  ?   Palpations: Abdomen is soft.  ?   Tenderness: There is no abdominal tenderness. There is no guarding.  ?Genitourinary: ?   Comments: NEFG. No blood. No abnormal discharge ?Neurological:  ?   General: No focal deficit present.  ?   Mental Status: She is alert.  ?Psychiatric:     ?   Mood and Affect: Mood normal.     ?   Behavior: Behavior normal.  ?  ? ?Labs ?Results for orders placed or performed during the hospital encounter of 02/24/22 (from the past 24 hour(s))  ?CBC     Status: Abnormal  ? Collection Time: 02/24/22  9:47 AM  ?Result Value Ref Range  ? WBC 7.9 4.0 - 10.5 K/uL  ? RBC 4.36 3.87 - 5.11 MIL/uL  ? Hemoglobin 11.2 (L) 12.0 - 15.0 g/dL  ? HCT 35.0 (L) 36.0 - 46.0 %  ? MCV 80.3 80.0 - 100.0 fL  ? MCH 25.7 (L) 26.0 - 34.0 pg  ? MCHC 32.0 30.0 - 36.0 g/dL  ? RDW 17.4 (H) 11.5 - 15.5 %  ? Platelets 350 150 - 400 K/uL  ? nRBC 0.0 0.0 - 0.2 %  ?ABO/Rh     Status: None  ? Collection Time: 02/24/22  9:47  AM  ?Result Value Ref Range  ? ABO/RH(D) O POS   ? No rh immune globuloin    ?  NOT A RH IMMUNE GLOBULIN CANDIDATE, PT RH POSITIVE ?Performed at [facility], [address] [place], [place] [address] ?  ?hCG, quantitative, pregnancy     Status: Abnormal  ? Collection Time: 02/24/22  9:47 AM  ?Result Value Ref Range  ? hCG, Beta Chain, Quant, S 4,331 (H) <5 mIU/mL  ?Wet prep, genital     Status: Abnormal  ? Collection Time: 02/24/22 10:03 AM  ? Specimen: PATH Cytology Cervicovaginal Ancillary Only  ?Result Value Ref Range  ? Yeast Wet Prep HPF POC NONE SEEN NONE SEEN  ? Trich, Wet Prep NONE SEEN NONE SEEN  ? Clue Cells Wet Prep HPF POC PRESENT (A) NONE SEEN  ? WBC, Wet Prep HPF POC >=10 (A) <10  ? Sperm NONE SEEN   ?Urinalysis, Routine w reflex microscopic Urine, Clean Catch     Status: Abnormal  ? Collection Time: 02/24/22 10:20 AM  ?Result Value Ref Range  ? Color, Urine YELLOW YELLOW  ? APPearance HAZY (A) CLEAR  ? Specific Gravity, Urine 1.023 1.005 - 1.030  ? pH 6.0 5.0 - 8.0  ? Glucose, UA NEGATIVE NEGATIVE mg/dL  ? Hgb urine dipstick NEGATIVE NEGATIVE  ? Bilirubin Urine NEGATIVE NEGATIVE  ? Ketones, ur NEGATIVE NEGATIVE mg/dL  ? Protein, ur NEGATIVE NEGATIVE mg/dL  ? Nitrite NEGATIVE NEGATIVE  ? Leukocytes,Ua NEGATIVE NEGATIVE  ? ? ?Imaging ?US OB LESS THAN 14 WEEKS WITH OB TRANSVAGINAL ? ?Result Date: 02/24/2022 ?CLINICAL DATA:  Vaginal bleeding EXAM: OBSTETRIC <14 WK [place] AND TRANSVAGINAL OB US DOPPLER ULTRASOUND OF OVARIES TECHNIQUE: Both transabdominal and transvaginal ultrasound examinations were performed for complete evaluation of the gestation as well as the maternal uterus, adnexal regions, and pelvic cul-de-sac. Transvaginal technique was performed to assess early pregnancy. Color and duplex Doppler ultrasound was utilized to evaluate blood flow to the ovaries. COMPARISON:  None. FINDINGS: Intrauterine gestational sac: Single Yolk sac:  Seen Embryo:  Not seen Cardiac Activity: Not seen MSD: 4.6  mm   5 w  1 d Subchorionic hemorrhage:  None visualized. Maternal uterus/adnexae: There is 4.4 cm simple appearing cyst in the left ovary, possibly functional ovarian cyst. Small amount of free fluid is seen in the pelvis. Pulsed Doppler evaluation of both ovaries demonstrates normal appearing low-resistance arterial and venous waveforms. IMPRESSION: There is a gestational sac containing yolk sac within the fundus of the uterus. There is no demonstrable fetal pole or fetal cardiac activity. By mean sac diameter measurement, estimated gestational age is 5 weeks 1 day. Serial HCG estimations and follow-up sonogram as warranted should be considered. There is 4.4 cm simple appearing cyst in the left adnexa suggesting functional left ovarian cyst. Small amount of free fluid in cul-de-sac may suggest recent rupture of ovarian cyst or follicle. Electronically Signed   By: Ernie AvenaPalani  Rathinasamy M.D.   On: 02/24/2022 11:04   ? ?MAU Course  ?Procedures ?Lab Orders    ?     Wet prep, genital    ?     Urinalysis, Routine w reflex microscopic Urine, Clean Catch    ?     CBC    ?     hCG, quantitative, pregnancy    ?No orders of the defined types were placed in this encounter. ? ?Imaging Orders    ?     US OB LESS THAN 14 WEEKS WITH OB TRANSVAGINAL    ? ? ?MDM ?+UPT ?UA, wet prep, GC/chlamydia, CBC, ABO/Rh, quant hCG, and US today to rule out ectopic pregnancy which can be life threatening.  ? ?RH positive ?No blood on exam. Spotting likely post coital.  ? ?Wet prep positive for clue cells but denies discharge & no abnormal discharge on exam. Doesn't meet amsel criteria for BV.  ? ?Ultrasound shows IUGS with yolk sac & HCG has risen appropriately since ED visit 2 days ago. .diag ?Assessment and Plan  ? ?1. Abdominal cramping affecting pregnancy   ?2. PCB (post coital bleeding)   ?3. [redacted] weeks gestation of pregnancy   ? ?-Reviewed SAB precautions & reasons to return to MAU ?-GC/CT pending ?[facility] ? ?Judeth HornErin Saige Busby, NP ?02/24/22 ?11:18 AM ? ? ?

## 2022-02-24 NOTE — Discharge Instructions (Signed)
Return to care  If you have heavier bleeding that soaks through more than 2 pads per hour for an hour or more If you bleed so much that you feel like you might pass out or you do pass out If you have significant abdominal pain that is not improved with Tylenol   

## 2022-02-26 LAB — GC/CHLAMYDIA PROBE AMP (~~LOC~~) NOT AT ARMC
Chlamydia: NEGATIVE
Comment: NEGATIVE
Comment: NORMAL
Neisseria Gonorrhea: NEGATIVE

## 2022-02-27 ENCOUNTER — Encounter: Payer: Self-pay | Admitting: Physician Assistant

## 2022-03-27 IMAGING — US US TRANSVAGINAL NON-OB
1 series · 13 of 25 positions shown · non-contrast
Comparison: None.

CLINICAL DATA: Pelvic pain.

EXAM:
TRANSABDOMINAL AND TRANSVAGINAL ULTRASOUND OF PELVIS
DOPPLER ULTRASOUND OF OVARIES
TECHNIQUE: Both transabdominal and transvaginal ultrasound examinations of the
pelvis were performed. Transabdominal technique was performed for
global imaging of the pelvis including uterus, ovaries, adnexal
regions, and pelvic cul-de-sac.
It was necessary to proceed with endovaginal exam following the
transabdominal exam to visualize the uterus, endometrium and
ovaries/adnexa to better advantage. Color and duplex Doppler
ultrasound was utilized to evaluate blood flow to the ovaries.

[Series 1: us transvaginal non-ob · 64 acquisitions, 13 frames shown]
[im 1/64]
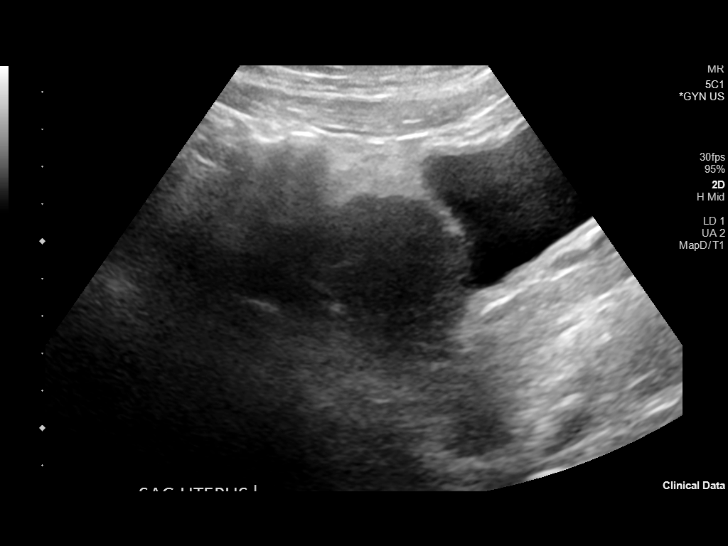
[im 6/64]
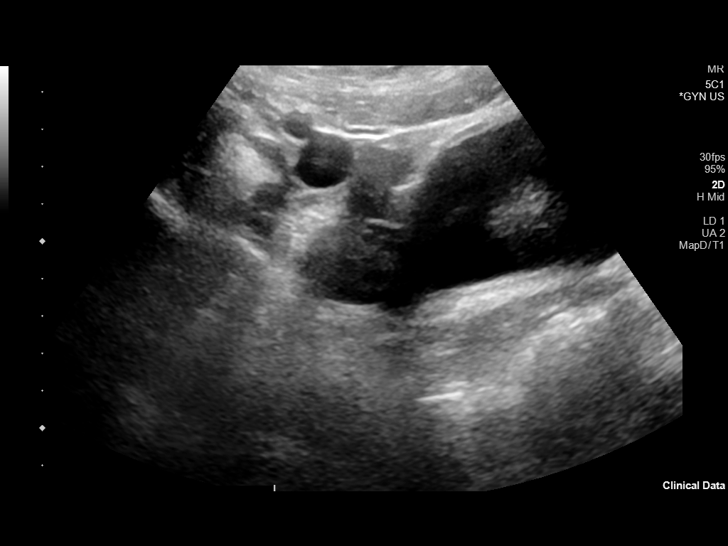
[im 11/64]
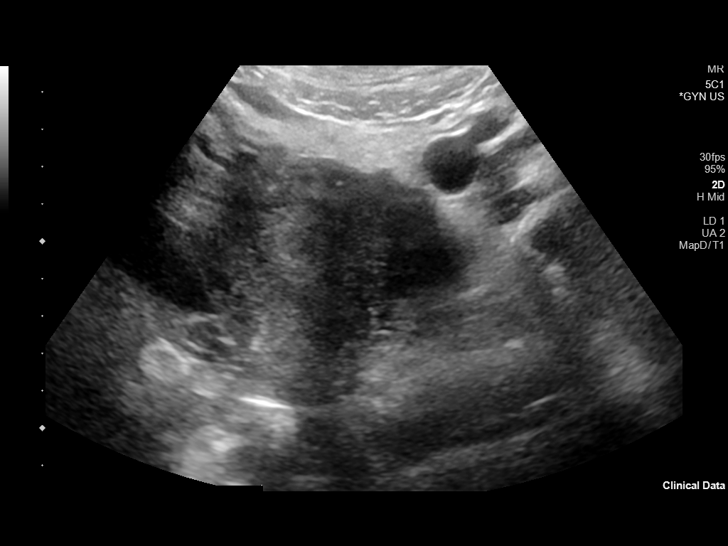
[im 16/64]
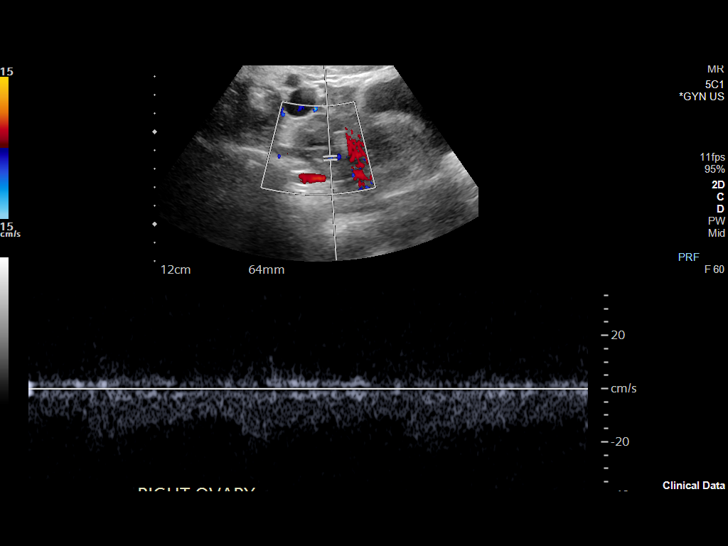
[im 22/64]
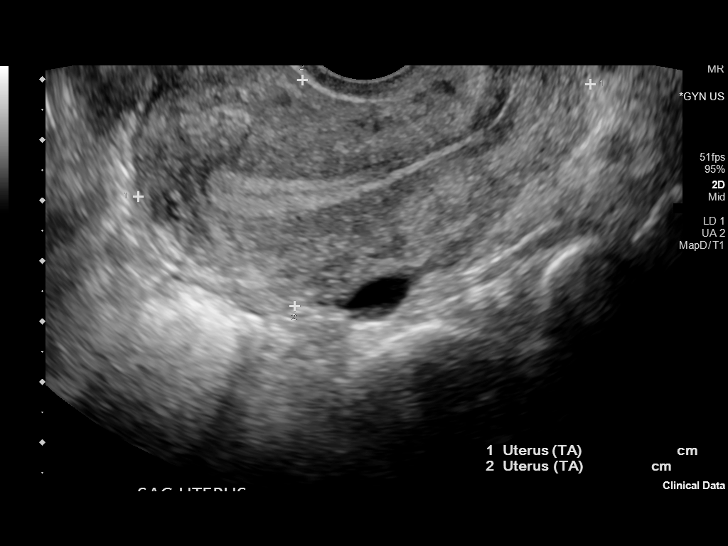
[im 27/64]
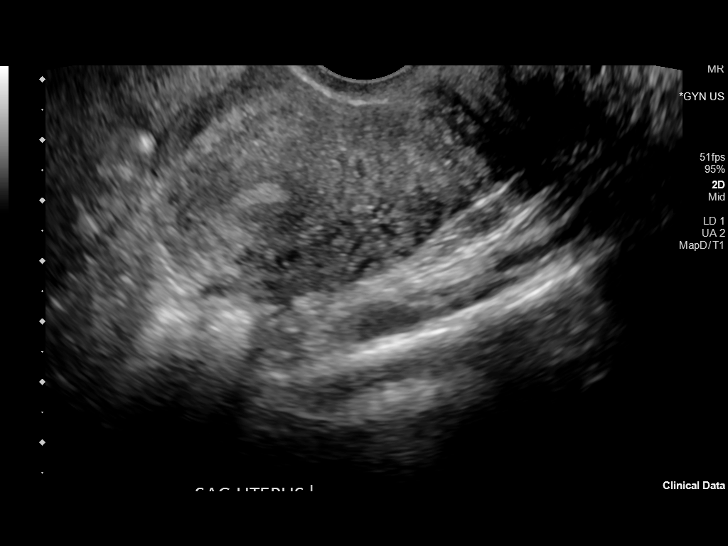
[im 32/64]
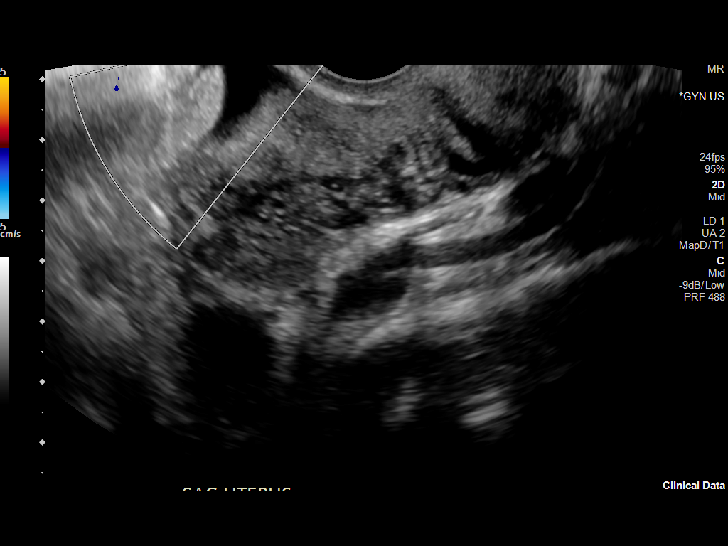
[im 37/64]
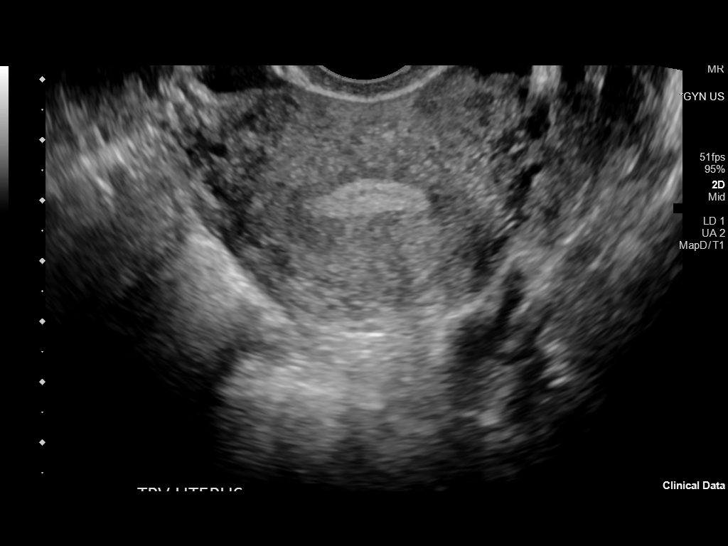
[im 43/64]
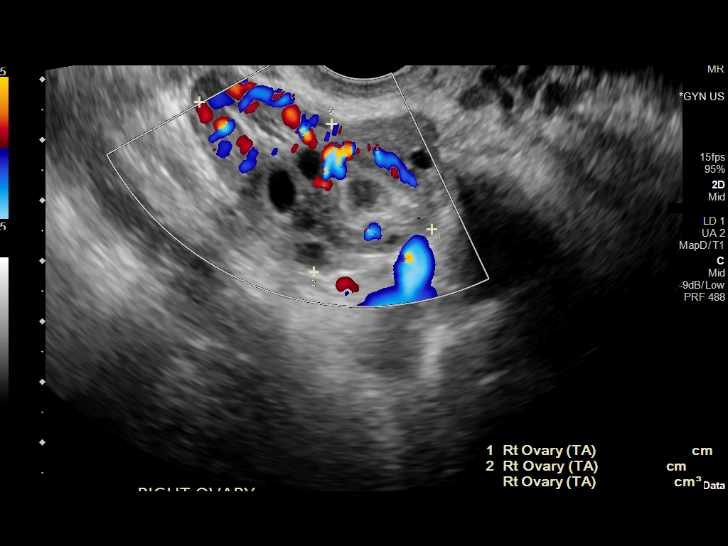
[im 48/64]
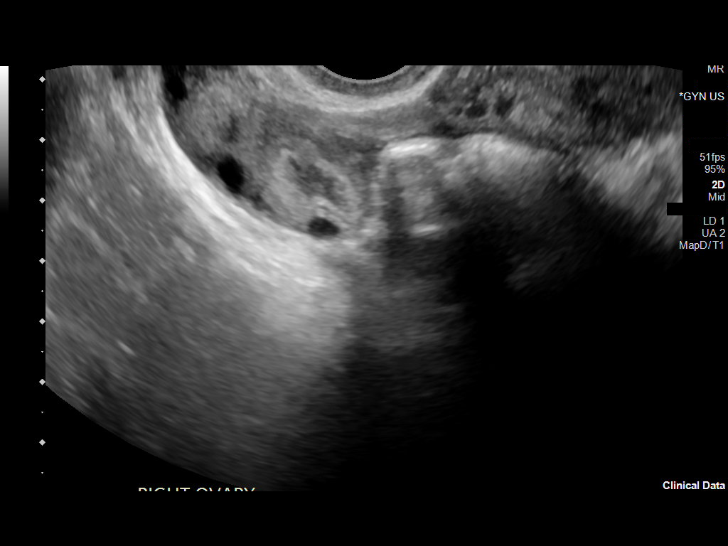
[im 53/64]
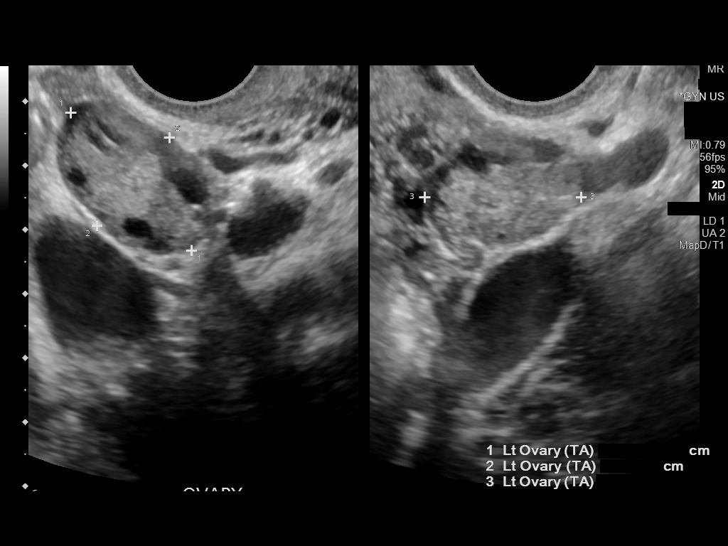
[im 58/64]
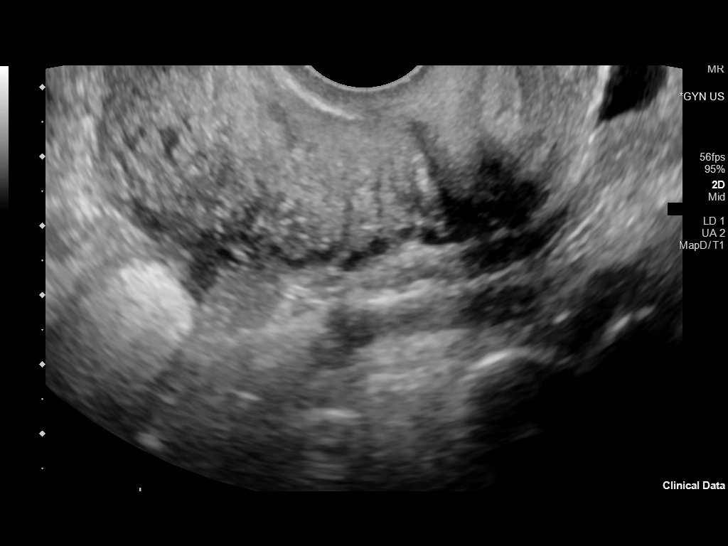
[im 64/64]
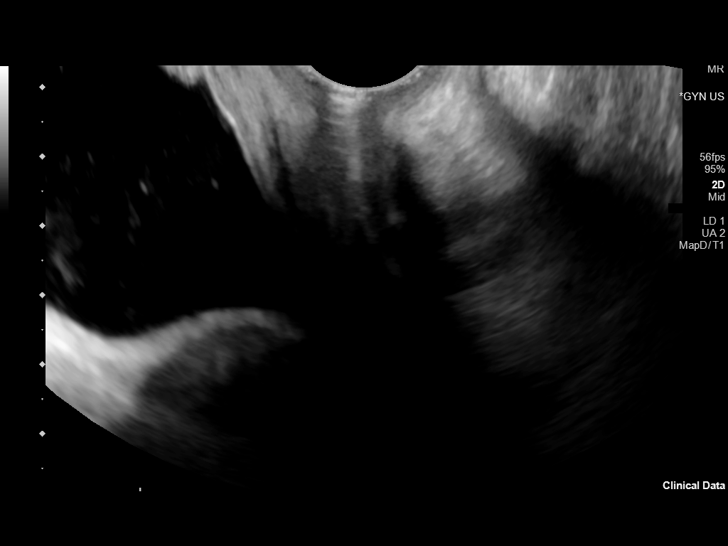

[13 of 25 positions shown; findings below may reference images not displayed]

FINDINGS: Uterus

Measurements: 7.7 x 3.7 x 5.5 cm = volume: 83 mL. No fibroids or
other mass visualized.

Endometrium

Thickness: 8 mm.  No focal abnormality visualized.

Right ovary

Measurements: 4.4 x 2.6 x 3.1 cm = volume: 18.4 mL. There is a
complex cystic lesion arising from the right ovary measuring 1.8 x
1.6 x 1.9 cm. This has an echogenic ring surrounding a central
cystic component, likely an involuting corpus luteum. Ovary
otherwise unremarkable. No adnexal masses.

Left ovary

Measurements: 2.9 x 1.8 x 2.4 cm = volume: 6.5 mL. Normal
appearance/no adnexal mass.

Pulsed Doppler evaluation of both ovaries demonstrates normal
low-resistance arterial and venous waveforms.

Other findings

No abnormal free fluid.
IMPRESSION: 1. Complex cystic lesion measuring 1.9 cm arises from the right
ovary, most likely an involuting corpus luteum versus a small
hemorrhagic cyst. If there is any chance of pregnancy in this
patient, a pregnancy test would be recommended. Otherwise,
Short-interval follow up ultrasound in 6-12 weeks is recommended,
preferably during the week following the patient's normal menses.
2. Exam otherwise within normal limits.

## 2022-03-27 IMAGING — US US PELVIS COMPLETE
1 series · 13 of 25 positions shown · non-contrast
Comparison: None.

CLINICAL DATA: Pelvic pain.

EXAM:
TRANSABDOMINAL AND TRANSVAGINAL ULTRASOUND OF PELVIS
DOPPLER ULTRASOUND OF OVARIES
TECHNIQUE: Both transabdominal and transvaginal ultrasound examinations of the
pelvis were performed. Transabdominal technique was performed for
global imaging of the pelvis including uterus, ovaries, adnexal
regions, and pelvic cul-de-sac.
It was necessary to proceed with endovaginal exam following the
transabdominal exam to visualize the uterus, endometrium and
ovaries/adnexa to better advantage. Color and duplex Doppler
ultrasound was utilized to evaluate blood flow to the ovaries.

[Series 1: us pelvis complete · 64 acquisitions, 13 frames shown]
[im 1/64]
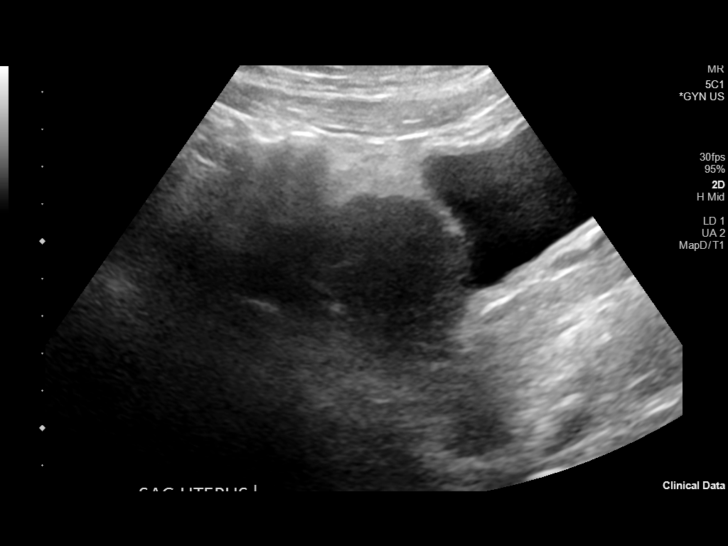
[im 6/64]
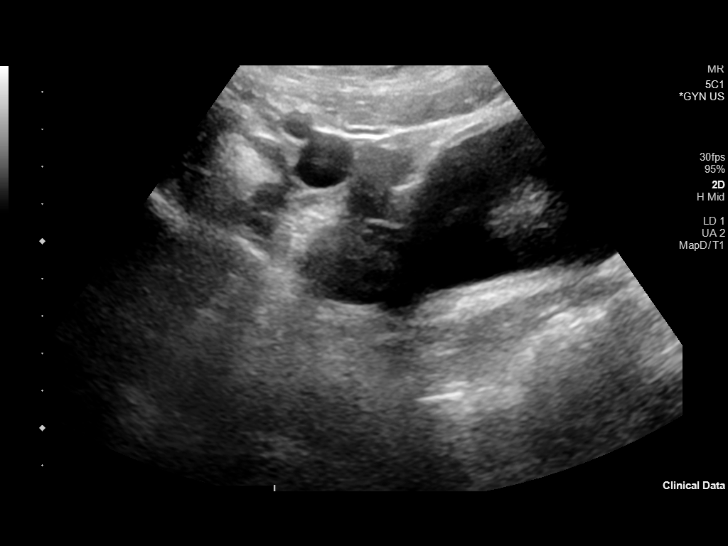
[im 11/64]
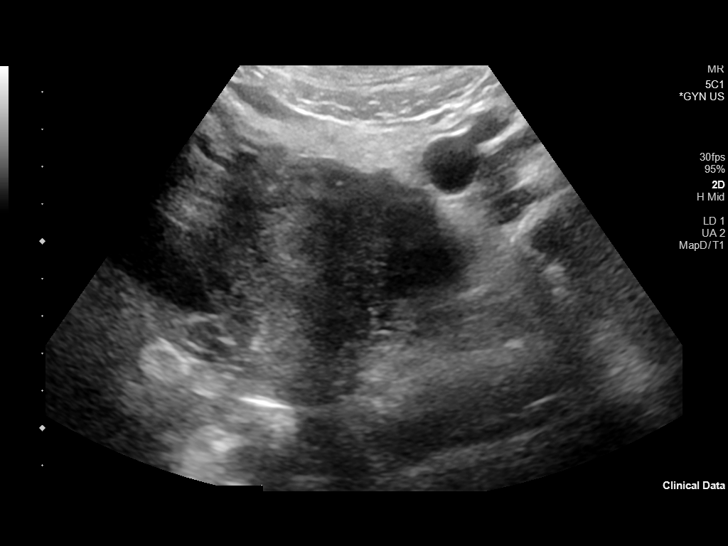
[im 16/64]
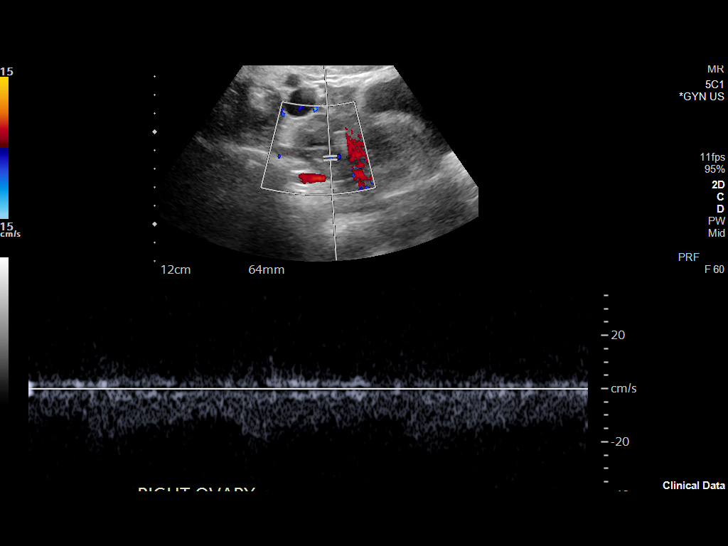
[im 22/64]
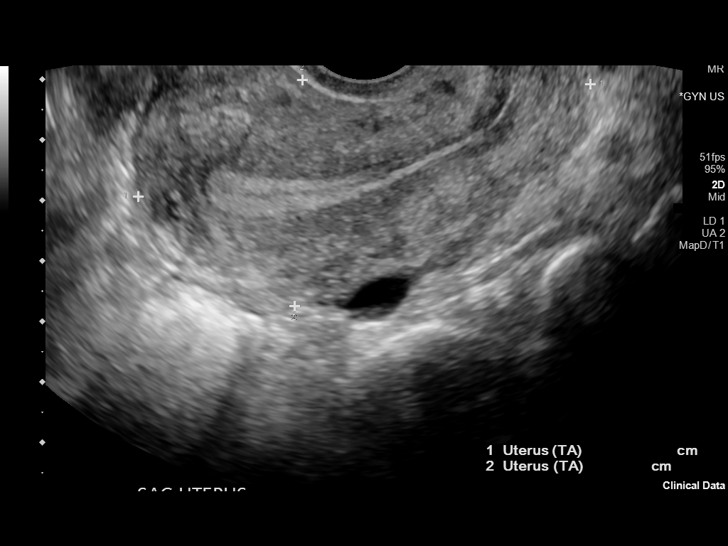
[im 27/64]
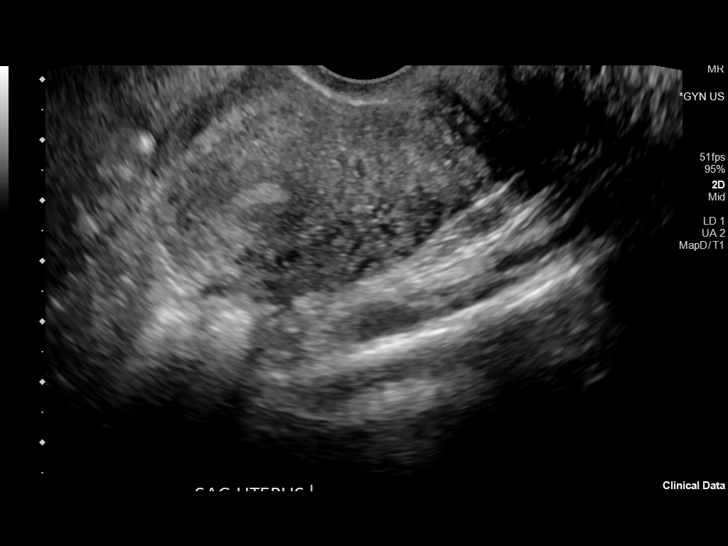
[im 32/64]
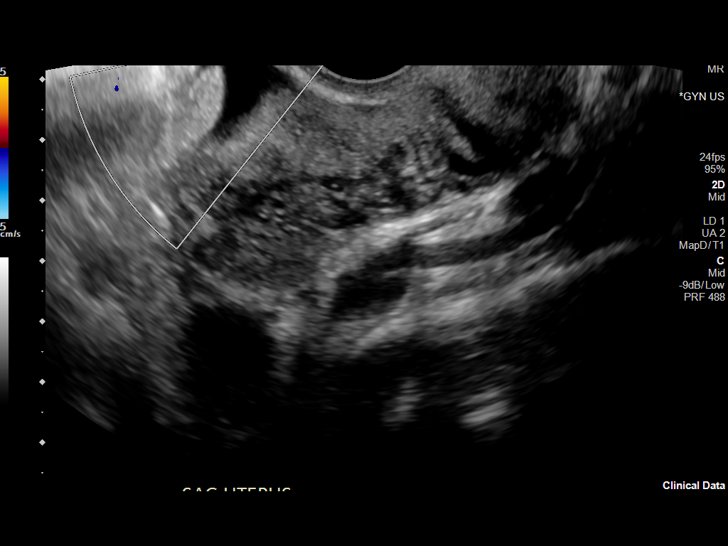
[im 37/64]
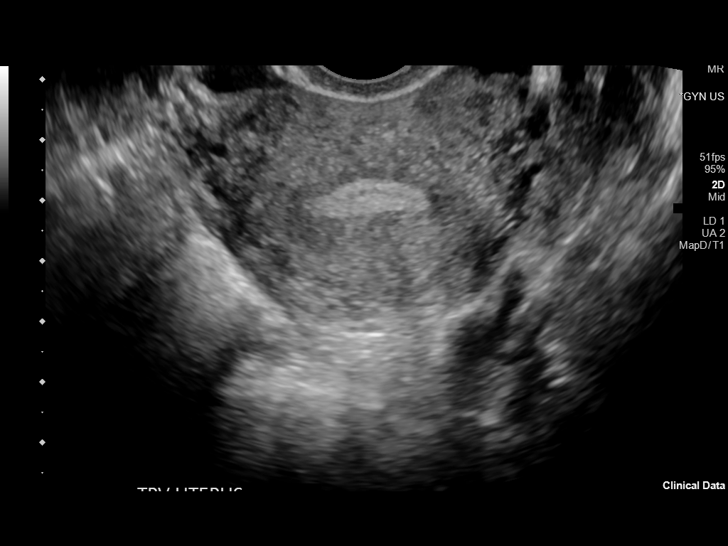
[im 43/64]
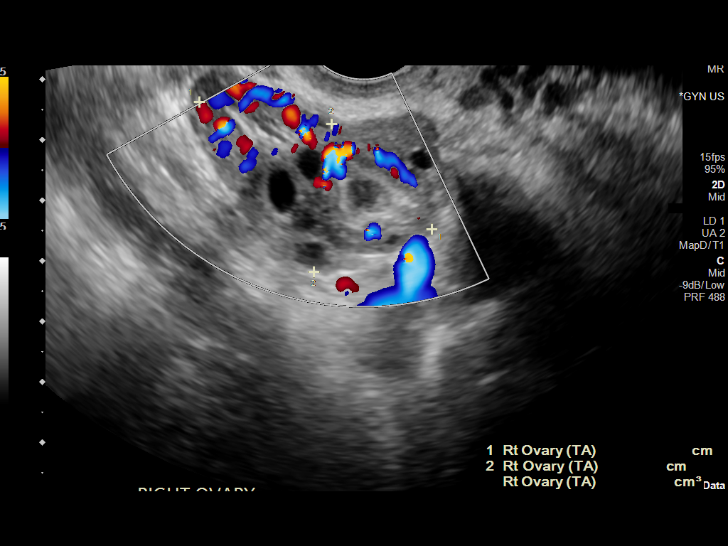
[im 48/64]
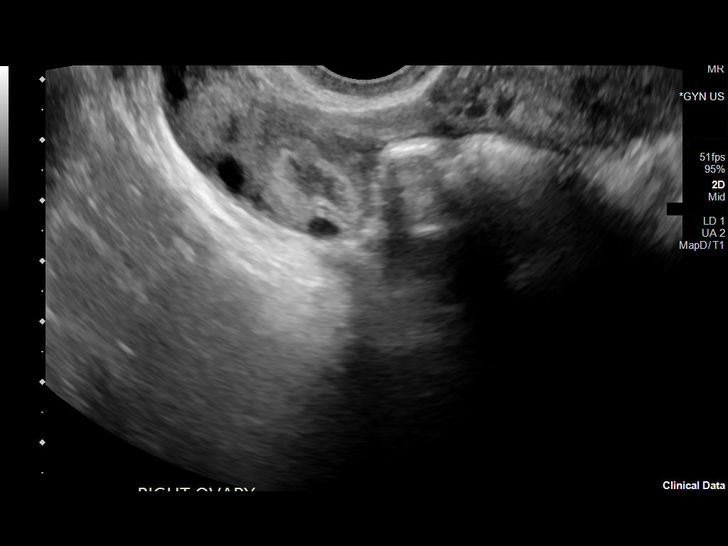
[im 53/64]
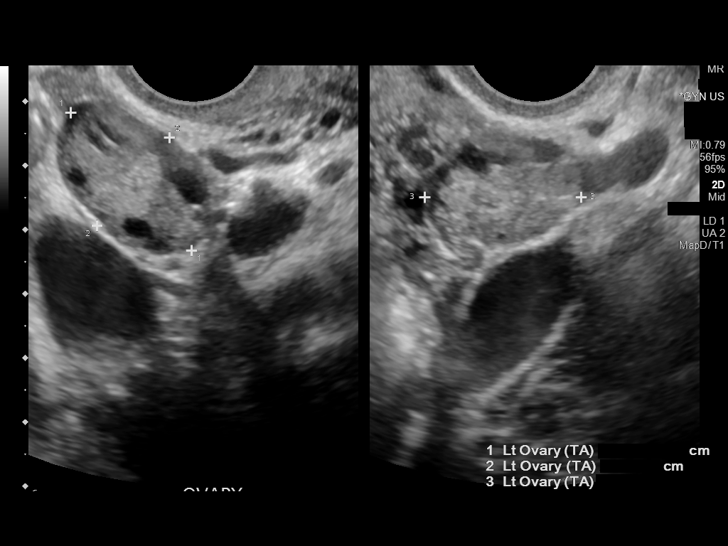
[im 58/64]
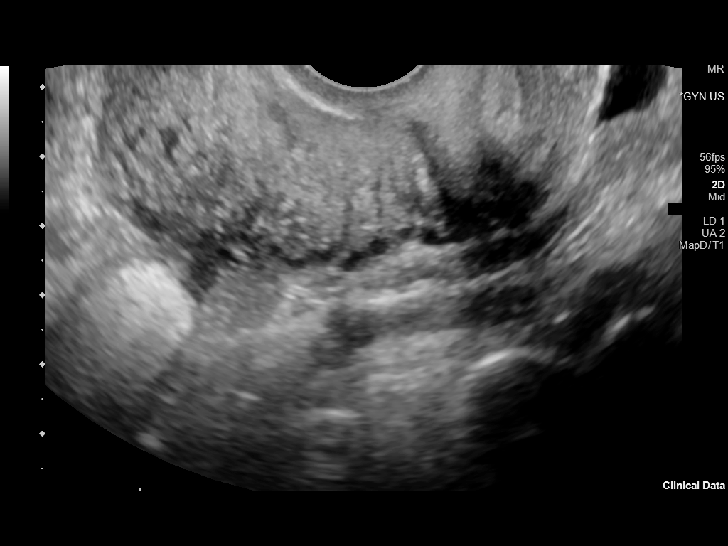
[im 64/64]
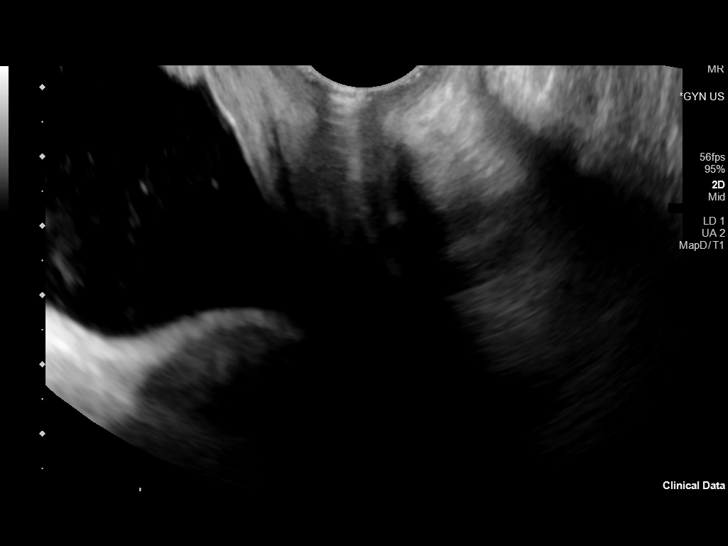

[13 of 25 positions shown; findings below may reference images not displayed]

FINDINGS: Uterus

Measurements: 7.7 x 3.7 x 5.5 cm = volume: 83 mL. No fibroids or
other mass visualized.

Endometrium

Thickness: 8 mm.  No focal abnormality visualized.

Right ovary

Measurements: 4.4 x 2.6 x 3.1 cm = volume: 18.4 mL. There is a
complex cystic lesion arising from the right ovary measuring 1.8 x
1.6 x 1.9 cm. This has an echogenic ring surrounding a central
cystic component, likely an involuting corpus luteum. Ovary
otherwise unremarkable. No adnexal masses.

Left ovary

Measurements: 2.9 x 1.8 x 2.4 cm = volume: 6.5 mL. Normal
appearance/no adnexal mass.

Pulsed Doppler evaluation of both ovaries demonstrates normal
low-resistance arterial and venous waveforms.

Other findings

No abnormal free fluid.
IMPRESSION: 1. Complex cystic lesion measuring 1.9 cm arises from the right
ovary, most likely an involuting corpus luteum versus a small
hemorrhagic cyst. If there is any chance of pregnancy in this
patient, a pregnancy test would be recommended. Otherwise,
Short-interval follow up ultrasound in 6-12 weeks is recommended,
preferably during the week following the patient's normal menses.
2. Exam otherwise within normal limits.

## 2022-04-10 LAB — HEPATITIS C ANTIBODY: HCV Ab: NEGATIVE

## 2022-04-10 LAB — OB RESULTS CONSOLE ABO/RH: RH Type: POSITIVE

## 2022-04-10 LAB — OB RESULTS CONSOLE ANTIBODY SCREEN: Antibody Screen: NEGATIVE

## 2022-04-10 LAB — OB RESULTS CONSOLE RUBELLA ANTIBODY, IGM: Rubella: IMMUNE

## 2022-04-10 LAB — OB RESULTS CONSOLE GBS: GBS: POSITIVE

## 2022-04-10 LAB — OB RESULTS CONSOLE HIV ANTIBODY (ROUTINE TESTING): HIV: NONREACTIVE

## 2022-04-10 LAB — OB RESULTS CONSOLE RPR: RPR: NONREACTIVE

## 2022-04-22 ENCOUNTER — Other Ambulatory Visit: Payer: Self-pay

## 2022-04-22 ENCOUNTER — Emergency Department (HOSPITAL_COMMUNITY)
Admission: EM | Admit: 2022-04-22 | Discharge: 2022-04-22 | Disposition: A | Discharge status: Home / Self Care | Payer: Medicaid Other | Attending: Emergency Medicine | Admitting: Emergency Medicine

## 2022-04-22 ENCOUNTER — Encounter (HOSPITAL_COMMUNITY): Payer: Self-pay

## 2022-04-22 DIAGNOSIS — J020 Streptococcal pharyngitis: Secondary | ICD-10-CM | POA: Diagnosis not present

## 2022-04-22 DIAGNOSIS — J029 Acute pharyngitis, unspecified: Secondary | ICD-10-CM | POA: Diagnosis present

## 2022-04-22 LAB — GROUP A STREP BY PCR: Group A Strep by PCR: DETECTED — AB

## 2022-04-22 MED ORDER — AMOXICILLIN 500 MG PO CAPS: 500.0000 mg | ORAL_CAPSULE | Freq: Two times a day (BID) | ORAL | 0 refills | Status: AC

## 2022-04-22 NOTE — ED Triage Notes (Signed)
Pt arrived via POV, c/o sore throat with "white spots" since yesterday. Sore to swallow/drink. No fevers at home.

## 2022-04-22 NOTE — Discharge Instructions (Addendum)
It was a pleasure taking care of you today!  Your strep test was positive in the ED.  You were treated with a one-time dose of amoxicillin.  You may take over-the-counter 500 mg Tylenol every 6 hours as needed for pain for no more than 7 days.  Ensure to maintain fluid intake with tea, broth, soup, Gatorade, Pedialyte, water.  You may follow-up with your primary care provider as needed.  You may follow-up with your OB as needed.  Return to the emergency department if experiencing increasing/worsening trouble swallowing, trouble breathing, fever, worsening symptoms.

## 2022-04-22 NOTE — ED Provider Notes (Signed)
[facility]-EMERGENCY DEPT Provider Note   CSN: [id number] Arrival date & time: 04/22/22  1452     History  Chief Complaint  Patient presents with   Sore Throat    Jenny Schwartz is a 26 y.o. female who presents to the emergency department complaint of a sore throat onset yesterday.  Has sick contacts at home with similar symptoms.  Has associated cough, painful swallowing.  Has tried cough drops and tea at home for her symptoms.  Denies fever, chills, trouble swallowing, nausea, vomiting.  The history is provided by the patient. No language interpreter was used.       Home Medications Prior to Admission medications   Medication Sig Start Date End Date Taking? Authorizing Provider  amoxicillin (AMOXIL) 500 MG capsule Take 1 capsule (500 mg total) by mouth 2 (two) times daily for 10 days. 04/22/22 05/02/22 Yes Holdan Stucke A, PA-C      Allergies    Patient has no known allergies.    Review of Systems   Review of Systems  Constitutional:  Negative for chills and fever.  HENT:  Positive for sore throat. Negative for trouble swallowing.        +painful swallowing  Respiratory:  Positive for cough.   Gastrointestinal:  Negative for nausea and vomiting.  All other systems reviewed and are negative.   Physical Exam Updated Vital Signs BP 128/69 (BP Location: Left Arm)   Pulse 78   Temp 98.4 F (36.9 C) (Oral)   Resp 20   LMP 01/23/2022 (Approximate)   SpO2 100%  Physical Exam Vitals and nursing note reviewed.  Constitutional:      General: She is not in acute distress.    Appearance: She is not diaphoretic.  HENT:     Head: Normocephalic and atraumatic.     Mouth/Throat:     Mouth: Mucous membranes are moist.     Pharynx: Oropharynx is clear. Uvula midline. No oropharyngeal exudate, posterior oropharyngeal erythema or uvula swelling.     Tonsils: Tonsillar exudate present.     Comments: Uvula midline without swelling.  Exudate noted to right  tonsil.  No posterior pharyngeal erythema noted. Eyes:     General: No scleral icterus.    Conjunctiva/sclera: Conjunctivae normal.  Cardiovascular:     Rate and Rhythm: Normal rate and regular rhythm.     Pulses: Normal pulses.     Heart sounds: Normal heart sounds.  Pulmonary:     Effort: Pulmonary effort is normal. No respiratory distress.     Breath sounds: Normal breath sounds. No wheezing.  Abdominal:     General: Bowel sounds are normal.     Palpations: Abdomen is soft. There is no mass.     Tenderness: There is no abdominal tenderness. There is no guarding or rebound.  Musculoskeletal:        General: Normal range of motion.     Cervical back: Normal range of motion and neck supple.  Skin:    General: Skin is warm and dry.  Neurological:     Mental Status: She is alert.  Psychiatric:        Behavior: Behavior normal.     ED Results / Procedures / Treatments   Labs (all labs ordered are listed, but only abnormal results are displayed) Labs Reviewed  GROUP A STREP BY PCR - Abnormal; Notable for the following components:      Result Value   Group A Strep by PCR DETECTED (*)  All other components within normal limits    EKG None  Radiology No results found.  Procedures Procedures    Medications Ordered in ED Medications - No data to display  ED Course/ Medical Decision Making/ A&P Clinical Course as of 04/22/22 2330  Sun Apr 22, 2022  1740 Group A Strep by PCR(!): DETECTED [SB]    Clinical Course User Index [SB] Jackelin Correia A, PA-C                           Medical Decision Making Amount and/or Complexity of Data Reviewed Labs:  Decision-making details documented in ED Course.  Risk Prescription drug management.   Pt presents with sore throat onset yesterday.  Has sick contacts at home with similar symptoms.  Has tried a cough drop and tea with relief of her symptoms.  Vital signs stable, patient afebrile.  On exam patient without acute  cardiovascular or respiratory exam findings.  Exudate noted to right tonsil.  Uvula midline without swelling.  Differential diagnosis includes strep pharyngitis, viral pharyngitis.   Labs:  I ordered, and personally interpreted labs.  The pertinent results include:   Strep swab positive   Disposition: Presentation suspicious strep pharyngitis.  Doubt viral pharyngitis. after consideration of the diagnostic results and the patients response to treatment, I feel that the patient would benefit from Discharge home.  Patient sent with a prescription for amoxicillin.  Confirmed with pharmacy that amoxicillin is suitable during pregnancy.  Supportive care measures and strict return precautions discussed with patient at bedside. Pt acknowledges and verbalizes understanding. Pt appears safe for discharge. Follow up as indicated in discharge paperwork.    This chart was dictated using voice recognition software, Dragon. Despite the best efforts of this provider to proofread and correct errors, errors may still occur which can change documentation meaning.  Final Clinical Impression(s) / ED Diagnoses Final diagnoses:  Strep pharyngitis    Rx / DC Orders ED Discharge Orders          Ordered    amoxicillin (AMOXIL) 500 MG capsule  2 times daily        04/22/22 1752              Clariza Sickman A, PA-C 04/22/22 2330    Terrilee Files, MD 04/23/22 1137

## 2022-05-23 ENCOUNTER — Encounter: Payer: Self-pay | Admitting: Obstetrics and Gynecology

## 2022-05-23 ENCOUNTER — Other Ambulatory Visit: Payer: Self-pay

## 2022-05-24 ENCOUNTER — Ambulatory Visit: Payer: Medicaid Other | Attending: Obstetrics and Gynecology

## 2022-05-24 ENCOUNTER — Ambulatory Visit: Payer: Self-pay

## 2022-05-24 DIAGNOSIS — O3510X0 Maternal care for (suspected) chromosomal abnormality in fetus, unspecified, not applicable or unspecified: Secondary | ICD-10-CM

## 2022-05-24 DIAGNOSIS — Z3A17 17 weeks gestation of pregnancy: Secondary | ICD-10-CM

## 2022-05-24 DIAGNOSIS — O285 Abnormal chromosomal and genetic finding on antenatal screening of mother: Secondary | ICD-10-CM

## 2022-05-24 DIAGNOSIS — Z315 Encounter for genetic counseling: Secondary | ICD-10-CM

## 2022-05-24 DIAGNOSIS — Z148 Genetic carrier of other disease: Secondary | ICD-10-CM

## 2022-05-24 NOTE — Progress Notes (Signed)
[facility] at [facility] [address], [address] Phone:  [phone]   Fax:  [phone]    Name: Shawnte Winton Indication: Discuss carrier screening results  DOB: 12-Jun-1996 Age: 26 y.o.   EDC: 10/30/2022 LMP: 01/24/2022 Referring Provider:  Charlett Nose, MD  EGA: [redacted]w[redacted]d Genetic Counselor: Teena Dunk, MS, CGC  OB Hx: [id number] Date of Appointment: 05/24/2022  Accompanied by: Malissa Hippo Face to Face Time: 35 Minutes   Previous Testing Completed: Shellee previously completed cell-free DNA screening (cfDNA) in this pregnancy. The result is low risk, consistent with a female fetus. This screening significantly reduces the risk that the current pregnancy has Down syndrome, Trisomy 79, Trisomy 11, and common sex chromosome conditions, however, the risk is not zero given the limitations of cfDNA. Additionally, there are many genetic conditions that cannot be detected by cfDNA.    Medical History:  This is Jerika's 2nd pregnancy. She has a living, healthy son from a previous partner. Reports she takes prenatal vitamins and promethazine. Denies personal history of diabetes, high blood pressure, thyroid conditions, and seizures. Denies bleeding, infections, and fevers in this pregnancy. Denies using tobacco, alcohol, or street drugs in this pregnancy.   Family History: A pedigree was created and scanned into Epic under the Media tab. Maternal ethnicity reported as Leisure centre manager and paternal ethnicity reported as Leisure centre manager. Denies Ashkenazi Jewish ancestry. Family history not remarkable for consanguinity, individuals with birth defects, intellectual disability, autism spectrum disorder, multiple spontaneous abortions, still births, or unexplained neonatal death.     Genetic Counseling:   Risk for Spinal Muscular Atrophy (SMA). Kaylynne and her reproductive partner, Deante, both screened to have an increased risk to be silent  carriers for SMA. 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 Specifically, given that Verle and Deante are Black/African American, there is approximately a 1 in 34 chance that they are each silent carriers. We reviewed the following with Gilberte: 1/34 (chance Kadiatou is a silent carrier) x 1/2 (chance Jamilex passes on her chromosome with no SMN1 genes to the pregnancy) x 1/34 (chance Deante is a silent carrier) x 1/2 (chance Deante passes on his chromosome with no SMN1 genes to the pregnancy) = 1/4,624 (chance the current pregnancy is affected with SMA given the information we have at this time). We reviewed that individuals with SMA have progressive, proximal, and symmetrical muscle weakness and atrophy due to degeneration of motor neurons of the spine and brainstem. Symptoms can first appear at any time between before birth to adulthood. There are five types of SMA that are historically classified based on clinical presentation, onset of symptoms, and the maximum skill level of motor milestones achieved, however, there is a lot of overlap between types. We discussed that there are several treatments available for individuals affected with SMA and studies show that treatment is most effective when it is started in the first few months of life. Surina has the option of continuing to monitor the pregnancy via routine ultrasounds. However, pregnancies with SMA may not demonstrate features of the condition on prenatal  ultrasound. Thus, a normal-appearing ultrasound would not rule out the possibility of  the baby being affected. If Dajane opted to monitor with only ultrasound examination, we recommend that her baby have an evaluation by pediatric genetics after birth to discuss postnatal genetic testing. We also discussed the option of amniocentesis for prenatal diagnosis. This procedure involves the removal of a small amount of amniotic fluid from the sac surrounding the pregnancy with the use of a thin needle inserted through the maternal abdomen and uterus. Ultrasound guidance is used throughout the procedure. Possible procedural difficulties and complications that can arise include maternal infection, cramping, bleeding, fluid leakage, and/or pregnancy loss. The risk for pregnancy loss with an amniocentesis is 1/500. Genetic testing that could be ordered on an amniocentesis sample includes a karyotype, microarray, and testing for specific syndromes such as SMA. After hearing the above information, Derrick declined amniocentesis for prenatal diagnosis. We reviewed that SMA is included in [place]'s newborn screening program.      Patient Plan:  Proceed with: Routine prenatal care. We recommend that Taquana's baby's pediatrician refer the baby to pediatric genetics after birth.   All questions were answered.  Declined: Amniocentesis   Thank you for sharing in the care of Cerita with [place].  Please do not hesitate to contact us if you have any questions.  Teena Dunk, MS, [facility]

## 2022-09-12 ENCOUNTER — Encounter (HOSPITAL_COMMUNITY): Payer: Self-pay | Admitting: Obstetrics and Gynecology

## 2022-09-12 ENCOUNTER — Inpatient Hospital Stay (HOSPITAL_COMMUNITY)
Admission: AD | Admit: 2022-09-12 | Discharge: 2022-09-13 | Disposition: A | Discharge status: Home / Self Care | Payer: Medicaid Other | Attending: Obstetrics and Gynecology | Admitting: Obstetrics and Gynecology

## 2022-09-12 DIAGNOSIS — R109 Unspecified abdominal pain: Secondary | ICD-10-CM | POA: Insufficient documentation

## 2022-09-12 DIAGNOSIS — Z3A33 33 weeks gestation of pregnancy: Secondary | ICD-10-CM | POA: Diagnosis not present

## 2022-09-12 DIAGNOSIS — O26899 Other specified pregnancy related conditions, unspecified trimester: Secondary | ICD-10-CM

## 2022-09-12 DIAGNOSIS — O26893 Other specified pregnancy related conditions, third trimester: Secondary | ICD-10-CM | POA: Diagnosis present

## 2022-09-12 DIAGNOSIS — Z0371 Encounter for suspected problem with amniotic cavity and membrane ruled out: Secondary | ICD-10-CM

## 2022-09-12 DIAGNOSIS — O99283 Endocrine, nutritional and metabolic diseases complicating pregnancy, third trimester: Secondary | ICD-10-CM | POA: Diagnosis not present

## 2022-09-12 DIAGNOSIS — E860 Dehydration: Secondary | ICD-10-CM

## 2022-09-12 DIAGNOSIS — O99333 Smoking (tobacco) complicating pregnancy, third trimester: Secondary | ICD-10-CM | POA: Insufficient documentation

## 2022-09-12 HISTORY — DX: Anemia, unspecified: D64.9

## 2022-09-12 LAB — URINALYSIS, ROUTINE W REFLEX MICROSCOPIC
Bacteria, UA: NONE SEEN
Bilirubin Urine: NEGATIVE
Glucose, UA: NEGATIVE mg/dL
Hgb urine dipstick: NEGATIVE
Ketones, ur: 80 mg/dL — AB
Nitrite: NEGATIVE
Protein, ur: 100 mg/dL — AB
Specific Gravity, Urine: 1.023 (ref 1.005–1.030)
pH: 5 (ref 5.0–8.0)

## 2022-09-12 MED ORDER — LACTATED RINGERS IV BOLUS
1000.0000 mL | Freq: Once | INTRAVENOUS | Status: AC
  Administered 2022-09-12: 1000 mL via INTRAVENOUS

## 2022-09-12 NOTE — MAU Note (Signed)
.  Jenny Schwartz is a 26 y.o. at 104w1d here in MAU reporting: intermittent lower ABD cramping that been ongoing, and possible PROM at 2225. Pt states was walking to the bathroom, but fluid leaked all down her legs, clear fluid, and unsure if continued and not wearing a pad. Pt reports pressure in buttocks as well. Pt has not felt baby for the last hour, usually very active at this time. Pt denies VB, PIH s/s, complications in the pregnancy.  Anemia, needs Iron IV  Onset of complaint: 2225 Pain score: 7/10 Vitals:   09/12/22 2305  BP: 110/65  Pulse: (!) 102  Resp: 18  Temp: 97.8 F (36.6 C)  SpO2: 99%     FHT:135 Lab orders placed from triage:  ua

## 2022-09-12 NOTE — MAU Provider Note (Signed)
History     CSN: [id number]  Arrival date and time: 09/12/22 2249   Event Date/Time   First Provider Initiated Contact with Patient 09/12/22 2320      Chief Complaint  Patient presents with   Rupture of Membranes   Abdominal Pain   HPI  Jenny Schwartz is a 26 y.o. G2P1001 at [redacted]w[redacted]d who presents for evaluation of possible rupture of membranes and abdominal pain. Patient reports she has been having cramping for several weeks but it got worse tonight. She reports it is frequent but she is unsure how often it's happening. Patient rates the pain as a 7/10 and has not tried anything for the pain. She reports she got up to go to the bathroom and had fluid run down her legs. She is unsure if it's her water or urine. She reports she has not felt anymore leaking and is not wearing a pad. She reports feeling pressure in buttocks.  She denies any vaginal bleeding. Denies any constipation, diarrhea or any urinary complaints. Reports normal fetal movement.   OB History     Gravida  2   Para  1   Term  1   Preterm      AB      Living  1      SAB      IAB      Ectopic      Multiple      Live Births  1           Past Medical History:  Diagnosis Date   Anemia     History reviewed. No pertinent surgical history.  History reviewed. No pertinent family history.  Social History   Tobacco Use   Smoking status: Some Days    Types: Cigars   Smokeless tobacco: Never  Vaping Use   Vaping Use: Former  Substance Use Topics   Alcohol use: No   Drug use: No    Allergies: No Known Allergies  Medications Prior to Admission  Medication Sig Dispense Refill Last Dose   Prenatal Vit-Fe Fumarate-FA (PRENATAL MULTIVITAMIN) TABS tablet Take 1 tablet by mouth daily at 12 noon.   09/12/2022    Review of Systems  Constitutional: Negative.  Negative for fatigue and fever.  HENT: Negative.    Respiratory: Negative.  Negative for shortness of breath.   Cardiovascular: Negative.   Negative for chest pain.  Gastrointestinal:  Positive for abdominal pain. Negative for constipation, diarrhea, nausea and vomiting.  Genitourinary:  Positive for vaginal discharge. Negative for dysuria and vaginal bleeding.  Neurological: Negative.  Negative for dizziness and headaches.   Physical Exam   Blood pressure 110/65, pulse (!) 102, temperature 97.8 F (36.6 C), temperature source Oral, resp. rate 18, height 5\' 9"  (1.753 m), weight 108.1 kg, last menstrual period 01/23/2022, SpO2 99 %.  Patient Vitals for the past 24 hrs:  BP Temp Temp src Pulse Resp SpO2 Height Weight  09/12/22 2305 110/65 97.8 F (36.6 C) Oral (!) 102 18 99 % 5\' 9"  (1.753 m) 108.1 kg    Physical Exam Vitals and nursing note reviewed.  Constitutional:      General: She is not in acute distress.    Appearance: She is well-developed.  HENT:     Head: Normocephalic.  Eyes:     Pupils: Pupils are equal, round, and reactive to light.  Cardiovascular:     Rate and Rhythm: Normal rate and regular rhythm.     Heart sounds: Normal heart sounds.  Pulmonary:     Effort: Pulmonary effort is normal. No respiratory distress.     Breath sounds: Normal breath sounds.  Abdominal:     General: Bowel sounds are normal. There is no distension.     Palpations: Abdomen is soft.     Tenderness: There is no abdominal tenderness.  Genitourinary:    Comments: Pelvic exam: Cervix pink, visually closed, without lesion, scant white creamy discharge, vaginal walls and external genitalia normal  Skin:    General: Skin is warm and dry.  Neurological:     Mental Status: She is alert and oriented to person, place, and time.  Psychiatric:        Mood and Affect: Mood normal.        Behavior: Behavior normal.        Thought Content: Thought content normal.        Judgment: Judgment normal.     Fetal Tracing:  Baseline: 125 Variability: moderate Accels: 15x15 Decels: none  Toco: none  Cervix:  closed/thick/posterior (Outer os 1cm)  MAU Course  Procedures  Results for orders placed or performed during the hospital encounter of 09/12/22 (from the past 24 hour(s))  Urinalysis, Routine w reflex microscopic     Status: Abnormal   Collection Time: 09/12/22 11:04 PM  Result Value Ref Range   Color, Urine AMBER (A) YELLOW   APPearance CLOUDY (A) CLEAR   Specific Gravity, Urine 1.023 1.005 - 1.030   pH 5.0 5.0 - 8.0   Glucose, UA NEGATIVE NEGATIVE mg/dL   Hgb urine dipstick NEGATIVE NEGATIVE   Bilirubin Urine NEGATIVE NEGATIVE   Ketones, ur 80 (A) NEGATIVE mg/dL   Protein, ur 563 (A) NEGATIVE mg/dL   Nitrite NEGATIVE NEGATIVE   Leukocytes,Ua SMALL (A) NEGATIVE   RBC / HPF 0-5 0 - 5 RBC/hpf   WBC, UA 6-10 0 - 5 WBC/hpf   Bacteria, UA NONE SEEN NONE SEEN   Squamous Epithelial / LPF 11-20 0 - 5   Mucus PRESENT   Wet prep, genital     Status: Abnormal   Collection Time: 09/12/22 11:33 PM  Result Value Ref Range   Yeast Wet Prep HPF POC NONE SEEN NONE SEEN   Trich, Wet Prep NONE SEEN NONE SEEN   Clue Cells Wet Prep HPF POC NONE SEEN NONE SEEN   WBC, Wet Prep HPF POC >=10 (A) <10   Sperm NONE SEEN      MDM Prenatal records from community office reviewed. Pregnancy uncomplicated. Labs ordered and reviewed.   UA Wet prep and gc/chlamydia  LR bolus  Patient sleeping upon reassessment.   Assessment and Plan   1. Encounter for suspected premature rupture of amniotic membranes, with rupture of membranes not found   2. [redacted] weeks gestation of pregnancy   3. Abdominal pain affecting pregnancy   4. Dehydration during pregnancy    -Discharge home in stable condition -Preterm labor precautions discussed -Patient advised to follow-up with OB as scheduled for prenatal care -Patient may return to MAU as needed or if her condition were to change or worsen  Rolm Bookbinder, CNM 09/13/2022, 12:35 AM

## 2022-09-13 DIAGNOSIS — Z3A33 33 weeks gestation of pregnancy: Secondary | ICD-10-CM

## 2022-09-13 DIAGNOSIS — Z0371 Encounter for suspected problem with amniotic cavity and membrane ruled out: Secondary | ICD-10-CM

## 2022-09-13 LAB — WET PREP, GENITAL
Clue Cells Wet Prep HPF POC: NONE SEEN
Sperm: NONE SEEN
Trich, Wet Prep: NONE SEEN
WBC, Wet Prep HPF POC: >=10 — AB (ref <10)
Yeast Wet Prep HPF POC: NONE SEEN

## 2022-09-13 LAB — GC/CHLAMYDIA PROBE AMP (~~LOC~~) NOT AT ARMC
Chlamydia: NEGATIVE
Comment: NEGATIVE
Comment: NORMAL
Neisseria Gonorrhea: NEGATIVE

## 2022-09-13 NOTE — Discharge Instructions (Signed)

## 2022-09-26 ENCOUNTER — Non-Acute Institutional Stay (HOSPITAL_COMMUNITY)
Admission: RE | Admit: 2022-09-26 | Discharge: 2022-09-26 | Disposition: A | Discharge status: Home / Self Care | Payer: Medicaid Other | Source: Ambulatory Visit | Attending: Internal Medicine | Admitting: Internal Medicine

## 2022-09-26 DIAGNOSIS — Z3A00 Weeks of gestation of pregnancy not specified: Secondary | ICD-10-CM | POA: Diagnosis not present

## 2022-09-26 DIAGNOSIS — O99019 Anemia complicating pregnancy, unspecified trimester: Secondary | ICD-10-CM | POA: Diagnosis present

## 2022-09-26 DIAGNOSIS — D649 Anemia, unspecified: Secondary | ICD-10-CM | POA: Insufficient documentation

## 2022-09-26 MED ORDER — SODIUM CHLORIDE 0.9 % IV SOLN: INTRAVENOUS | Status: DC | PRN

## 2022-09-26 MED ORDER — SODIUM CHLORIDE 0.9 % IV SOLN
510.0000 mg | Freq: Once | INTRAVENOUS | Status: AC
  Administered 2022-09-26: 510 mg via INTRAVENOUS
  Filled 2022-09-26: qty 17

## 2022-09-26 NOTE — Progress Notes (Signed)
PATIENT CARE CENTER NOTE  Diagnosis: ICD-10: 099.019: Anemia complicating pregnancy, unspecified trimester   Provider: Dr. Deirdre Peer, MD  Procedure: Feraheme 510 mg  Note: Patient received Feraheme 510 mg infusion (dose #1 of 1) via PIV. Tolerated infusion well with no adverse reaction. Vital signs stable. Discharge instructions given. Pt observed for 30 minutes post infusion. Alert, oriented and ambulatory at discharge.

## 2022-10-24 ENCOUNTER — Telehealth (HOSPITAL_COMMUNITY): Payer: Self-pay | Admitting: *Deleted

## 2022-10-25 ENCOUNTER — Encounter (HOSPITAL_COMMUNITY): Payer: Self-pay

## 2022-10-25 ENCOUNTER — Telehealth (HOSPITAL_COMMUNITY): Payer: Self-pay | Admitting: *Deleted

## 2022-10-25 NOTE — Telephone Encounter (Signed)
Preadmission screen  

## 2022-10-26 ENCOUNTER — Telehealth (HOSPITAL_COMMUNITY): Payer: Self-pay | Admitting: *Deleted

## 2022-10-26 NOTE — Telephone Encounter (Signed)
Preadmission screen  

## 2022-10-29 ENCOUNTER — Inpatient Hospital Stay (HOSPITAL_COMMUNITY)
Admission: AD | Admit: 2022-10-29 | Discharge: 2022-10-30 | DRG: 807 | Disposition: A | Discharge status: Home / Self Care | Payer: Medicaid Other | Attending: Obstetrics and Gynecology | Admitting: Obstetrics and Gynecology

## 2022-10-29 ENCOUNTER — Other Ambulatory Visit: Payer: Self-pay

## 2022-10-29 ENCOUNTER — Encounter (HOSPITAL_COMMUNITY): Payer: Self-pay | Admitting: Obstetrics and Gynecology

## 2022-10-29 ENCOUNTER — Inpatient Hospital Stay (HOSPITAL_COMMUNITY): Payer: Medicaid Other | Admitting: Anesthesiology

## 2022-10-29 DIAGNOSIS — Z148 Genetic carrier of other disease: Secondary | ICD-10-CM

## 2022-10-29 DIAGNOSIS — O99214 Obesity complicating childbirth: Secondary | ICD-10-CM | POA: Diagnosis present

## 2022-10-29 DIAGNOSIS — F17290 Nicotine dependence, other tobacco product, uncomplicated: Secondary | ICD-10-CM | POA: Diagnosis present

## 2022-10-29 DIAGNOSIS — Z3A39 39 weeks gestation of pregnancy: Secondary | ICD-10-CM

## 2022-10-29 DIAGNOSIS — O99824 Streptococcus B carrier state complicating childbirth: Secondary | ICD-10-CM | POA: Diagnosis present

## 2022-10-29 DIAGNOSIS — O26893 Other specified pregnancy related conditions, third trimester: Secondary | ICD-10-CM | POA: Diagnosis present

## 2022-10-29 DIAGNOSIS — O99334 Smoking (tobacco) complicating childbirth: Secondary | ICD-10-CM | POA: Diagnosis present

## 2022-10-29 DIAGNOSIS — O9902 Anemia complicating childbirth: Secondary | ICD-10-CM | POA: Diagnosis present

## 2022-10-29 LAB — RPR: RPR Ser Ql: NONREACTIVE

## 2022-10-29 LAB — CBC
HCT: 29.1 % — ABNORMAL LOW (ref 36.0–46.0)
Hemoglobin: 9.6 g/dL — ABNORMAL LOW (ref 12.0–15.0)
MCH: 26.2 pg (ref 26.0–34.0)
MCHC: 33 g/dL (ref 30.0–36.0)
MCV: 79.5 fL — ABNORMAL LOW (ref 80.0–100.0)
Platelets: 327 10*3/uL (ref 150–400)
RBC: 3.66 MIL/uL — ABNORMAL LOW (ref 3.87–5.11)
RDW: 19.1 % — ABNORMAL HIGH (ref 11.5–15.5)
WBC: 12.3 10*3/uL — ABNORMAL HIGH (ref 4.0–10.5)
nRBC: 0 % (ref 0.0–0.2)

## 2022-10-29 LAB — POCT FERN TEST: POCT Fern Test: NEGATIVE

## 2022-10-29 LAB — WET PREP, GENITAL
Sperm: NONE SEEN
Trich, Wet Prep: NONE SEEN
WBC, Wet Prep HPF POC: >=10 — AB (ref <10)
Yeast Wet Prep HPF POC: NONE SEEN

## 2022-10-29 LAB — HIV ANTIBODY (ROUTINE TESTING W REFLEX): HIV Screen 4th Generation wRfx: NONREACTIVE

## 2022-10-29 LAB — TYPE AND SCREEN
ABO/RH(D): O POS
Antibody Screen: NEGATIVE

## 2022-10-29 LAB — AMNISURE RUPTURE OF MEMBRANE (ROM) NOT AT ARMC: Amnisure ROM: POSITIVE

## 2022-10-29 MED ORDER — EPHEDRINE 5 MG/ML INJ: 10.0000 mg | INTRAVENOUS | Status: DC | PRN

## 2022-10-29 MED ORDER — FENTANYL-BUPIVACAINE-NACL 0.5-0.125-0.9 MG/250ML-% EP SOLN
12.0000 mL/h | EPIDURAL | Status: DC | PRN
  Administered 2022-10-29: 12 mL/h via EPIDURAL
  Filled 2022-10-29: qty 250

## 2022-10-29 MED ORDER — IBUPROFEN 600 MG PO TABS
600.0000 mg | ORAL_TABLET | Freq: Four times a day (QID) | ORAL | Status: DC
  Administered 2022-10-29 – 2022-10-30 (×5): 600 mg via ORAL
  Filled 2022-10-29 (×5): qty 1

## 2022-10-29 MED ORDER — TETANUS-DIPHTH-ACELL PERTUSSIS 5-2.5-18.5 LF-MCG/0.5 IM SUSY: 0.5000 mL | PREFILLED_SYRINGE | Freq: Once | INTRAMUSCULAR | Status: DC

## 2022-10-29 MED ORDER — TERBUTALINE SULFATE 1 MG/ML IJ SOLN: 0.2500 mg | Freq: Once | INTRAMUSCULAR | Status: DC | PRN

## 2022-10-29 MED ORDER — WITCH HAZEL-GLYCERIN EX PADS: 1.0000 | MEDICATED_PAD | CUTANEOUS | Status: DC | PRN

## 2022-10-29 MED ORDER — LACTATED RINGERS IV SOLN: INTRAVENOUS | Status: DC

## 2022-10-29 MED ORDER — SOD CITRATE-CITRIC ACID 500-334 MG/5ML PO SOLN: 30.0000 mL | ORAL | Status: DC | PRN

## 2022-10-29 MED ORDER — LACTATED RINGERS IV SOLN: 500.0000 mL | INTRAVENOUS | Status: DC | PRN

## 2022-10-29 MED ORDER — DIPHENHYDRAMINE HCL 50 MG/ML IJ SOLN: 12.5000 mg | INTRAMUSCULAR | Status: DC | PRN

## 2022-10-29 MED ORDER — BENZOCAINE-MENTHOL 20-0.5 % EX AERO: 1.0000 | INHALATION_SPRAY | CUTANEOUS | Status: DC | PRN

## 2022-10-29 MED ORDER — COCONUT OIL OIL: 1.0000 | TOPICAL_OIL | Status: DC | PRN

## 2022-10-29 MED ORDER — TRANEXAMIC ACID-NACL 1000-0.7 MG/100ML-% IV SOLN
INTRAVENOUS | Status: AC
  Filled 2022-10-29: qty 100

## 2022-10-29 MED ORDER — SENNOSIDES-DOCUSATE SODIUM 8.6-50 MG PO TABS
2.0000 | ORAL_TABLET | ORAL | Status: DC
  Administered 2022-10-29: 2 via ORAL
  Filled 2022-10-29 (×2): qty 2

## 2022-10-29 MED ORDER — LIDOCAINE HCL (PF) 1 % IJ SOLN
INTRAMUSCULAR | Status: DC | PRN
  Administered 2022-10-29: 12 mL via EPIDURAL

## 2022-10-29 MED ORDER — LIDOCAINE HCL (PF) 1 % IJ SOLN: 30.0000 mL | INTRAMUSCULAR | Status: DC | PRN

## 2022-10-29 MED ORDER — OXYTOCIN-SODIUM CHLORIDE 30-0.9 UT/500ML-% IV SOLN: 2.5000 [IU]/h | INTRAVENOUS | Status: DC

## 2022-10-29 MED ORDER — OXYCODONE-ACETAMINOPHEN 5-325 MG PO TABS: 1.0000 | ORAL_TABLET | ORAL | Status: DC | PRN

## 2022-10-29 MED ORDER — DIPHENHYDRAMINE HCL 25 MG PO CAPS: 25.0000 mg | ORAL_CAPSULE | Freq: Four times a day (QID) | ORAL | Status: DC | PRN

## 2022-10-29 MED ORDER — FLEET ENEMA 7-19 GM/118ML RE ENEM: 1.0000 | ENEMA | RECTAL | Status: DC | PRN

## 2022-10-29 MED ORDER — ACETAMINOPHEN 325 MG PO TABS
650.0000 mg | ORAL_TABLET | ORAL | Status: DC | PRN
  Administered 2022-10-29: 650 mg via ORAL
  Filled 2022-10-29: qty 2

## 2022-10-29 MED ORDER — OXYTOCIN BOLUS FROM INFUSION
333.0000 mL | Freq: Once | INTRAVENOUS | Status: AC
  Administered 2022-10-29: 333 mL via INTRAVENOUS

## 2022-10-29 MED ORDER — ZOLPIDEM TARTRATE 5 MG PO TABS: 5.0000 mg | ORAL_TABLET | Freq: Every evening | ORAL | Status: DC | PRN

## 2022-10-29 MED ORDER — ONDANSETRON HCL 4 MG/2ML IJ SOLN
4.0000 mg | Freq: Four times a day (QID) | INTRAMUSCULAR | Status: DC | PRN
  Administered 2022-10-29: 4 mg via INTRAVENOUS
  Filled 2022-10-29: qty 2

## 2022-10-29 MED ORDER — PENICILLIN G POT IN DEXTROSE 60000 UNIT/ML IV SOLN: 3.0000 10*6.[IU] | INTRAVENOUS | Status: DC

## 2022-10-29 MED ORDER — OXYCODONE-ACETAMINOPHEN 5-325 MG PO TABS: 2.0000 | ORAL_TABLET | ORAL | Status: DC | PRN

## 2022-10-29 MED ORDER — ONDANSETRON HCL 4 MG/2ML IJ SOLN: 4.0000 mg | INTRAMUSCULAR | Status: DC | PRN

## 2022-10-29 MED ORDER — DIBUCAINE (PERIANAL) 1 % EX OINT: 1.0000 | TOPICAL_OINTMENT | CUTANEOUS | Status: DC | PRN

## 2022-10-29 MED ORDER — ACETAMINOPHEN 325 MG PO TABS: 650.0000 mg | ORAL_TABLET | ORAL | Status: DC | PRN

## 2022-10-29 MED ORDER — OXYTOCIN-SODIUM CHLORIDE 30-0.9 UT/500ML-% IV SOLN
1.0000 m[IU]/min | INTRAVENOUS | Status: DC
  Administered 2022-10-29: 2 m[IU]/min via INTRAVENOUS
  Filled 2022-10-29: qty 500

## 2022-10-29 MED ORDER — SIMETHICONE 80 MG PO CHEW: 80.0000 mg | CHEWABLE_TABLET | ORAL | Status: DC | PRN

## 2022-10-29 MED ORDER — FENTANYL-BUPIVACAINE-NACL 0.5-0.125-0.9 MG/250ML-% EP SOLN: 12.0000 mL/h | EPIDURAL | Status: DC | PRN

## 2022-10-29 MED ORDER — PRENATAL MULTIVITAMIN CH
1.0000 | ORAL_TABLET | Freq: Every day | ORAL | Status: DC
  Administered 2022-10-29 – 2022-10-30 (×2): 1 via ORAL
  Filled 2022-10-29 (×2): qty 1

## 2022-10-29 MED ORDER — SODIUM CHLORIDE 0.9 % IV SOLN
5.0000 10*6.[IU] | Freq: Once | INTRAVENOUS | Status: AC
  Administered 2022-10-29: 5 10*6.[IU] via INTRAVENOUS
  Filled 2022-10-29: qty 5

## 2022-10-29 MED ORDER — ONDANSETRON HCL 4 MG PO TABS: 4.0000 mg | ORAL_TABLET | ORAL | Status: DC | PRN

## 2022-10-29 MED ORDER — LACTATED RINGERS IV SOLN
500.0000 mL | Freq: Once | INTRAVENOUS | Status: AC
  Administered 2022-10-29: 500 mL via INTRAVENOUS

## 2022-10-29 MED ORDER — PHENYLEPHRINE 80 MCG/ML (10ML) SYRINGE FOR IV PUSH (FOR BLOOD PRESSURE SUPPORT): 80.0000 ug | PREFILLED_SYRINGE | INTRAVENOUS | Status: DC | PRN

## 2022-10-29 MED ORDER — TRANEXAMIC ACID-NACL 1000-0.7 MG/100ML-% IV SOLN
1000.0000 mg | INTRAVENOUS | Status: AC
  Administered 2022-10-29: 1000 mg via INTRAVENOUS
  Filled 2022-10-29: qty 100

## 2022-10-29 NOTE — MAU Provider Note (Signed)
S: [name] is a 26 y.o. G2P1001 at [redacted]w[redacted]d  who presents to MAU today complaining of leaking of fluid since 1000 on Sunday 10/28/2022. She endorses vaginal bleeding. She endorses contractions. She reports normal fetal movement.    O: BP 132/74 (BP Location: Right Arm)   Pulse 87   Temp 98 F (36.7 C) (Oral)   Resp 18   Ht 5\' 9"  (1.753 m)   Wt 110.2 kg   LMP 01/23/2022 (Approximate)   SpO2 99%   BMI 35.88 kg/m  GENERAL: Well-developed, well-nourished female in no acute distress.  HEAD: Normocephalic, atraumatic.  CHEST: Normal effort of breathing, regular heart rate ABDOMEN: Soft, nontender, gravid PELVIC: Normal external female genitalia. Vagina is pink and rugated. Cervix with normal contour, no lesions. Bubbly pinkish-brown discharge.  ?(+) pooling.   Cervical exam:  Dilation: 2 Effacement (%): 50 Cervical Position: Middle Station: -3 Presentation: Vertex Exam by:: [email or web address], CNM   Fetal Monitoring: Baseline: 135 Variability: moderate Accelerations: present Decelerations: absent Contractions: regular every 4-5 minutes  Results for orders placed or performed during the hospital encounter of 10/29/22 (from the past 24 hour(s))  POCT fern test     Status: Normal   Collection Time: 10/29/22  1:52 AM  Result Value Ref Range   POCT Fern Test Negative = intact amniotic membranes   Wet prep, genital     Status: Abnormal   Collection Time: 10/29/22  2:36 AM  Result Value Ref Range   Yeast Wet Prep HPF POC NONE SEEN NONE SEEN   Trich, Wet Prep NONE SEEN NONE SEEN   Clue Cells Wet Prep HPF POC PRESENT (A) NONE SEEN   WBC, Wet Prep HPF POC >=10 (A) <10   Sperm NONE SEEN   Amnisure rupture of membrane (rom)not at [facility]     Status: None   Collection Time: 10/29/22  2:37 AM  Result Value Ref Range   Amnisure ROM POSITIVE    A: [facility] at [redacted]w[redacted]d  SROM  P: Report given to RN to contact MD on call for further instructions  [redacted]w[redacted]d, CNM 10/29/2022, 2:39 AM

## 2022-10-29 NOTE — Anesthesia Postprocedure Evaluation (Signed)
Anesthesia Post Note  Patient: Royanne Foots  Procedure(s) Performed: AN AD HOC LABOR EPIDURAL     Patient location during evaluation: Mother Baby Anesthesia Type: Epidural Level of consciousness: awake and alert Pain management: pain level controlled Vital Signs Assessment: post-procedure vital signs reviewed and stable Respiratory status: spontaneous breathing, nonlabored ventilation and respiratory function stable Cardiovascular status: stable Postop Assessment: no headache, no backache and epidural receding Anesthetic complications: no   No notable events documented.  Last Vitals:  Vitals:   10/29/22 0932 10/29/22 1030  BP: 106/60 116/68  Pulse: 71   Resp: 16 16  Temp: 36.8 C 36.6 C  SpO2: 100% 100%    Last Pain:  Vitals:   10/29/22 1030  TempSrc: Oral  PainSc:    Pain Goal:                   Jaedyn Lard

## 2022-10-29 NOTE — Anesthesia Preprocedure Evaluation (Signed)
Anesthesia Evaluation  Patient identified by MRN, date of birth, ID band Patient awake    Reviewed: Allergy & Precautions, H&P , NPO status , Patient's Chart, lab work & pertinent test results, reviewed documented beta blocker date and time   Airway Mallampati: II  TM Distance: >3 FB Neck ROM: full    Dental no notable dental hx. (+) Teeth Intact, Dental Advisory Given   Pulmonary neg pulmonary ROS, Current Smoker   Pulmonary exam normal breath sounds clear to auscultation       Cardiovascular negative cardio ROS Normal cardiovascular exam Rhythm:regular Rate:Normal     Neuro/Psych negative neurological ROS  negative psych ROS   GI/Hepatic negative GI ROS, Neg liver ROS,,,  Endo/Other  negative endocrine ROS    Renal/GU negative Renal ROS  negative genitourinary   Musculoskeletal   Abdominal   Peds  Hematology  (+) Blood dyscrasia, anemia   Anesthesia Other Findings   Reproductive/Obstetrics (+) Pregnancy                             Anesthesia Physical Anesthesia Plan  ASA: 2  Anesthesia Plan: Epidural   Post-op Pain Management:    Induction: Intravenous  PONV Risk Score and Plan: 2 and Treatment may vary due to age or medical condition  Airway Management Planned: Natural Airway  Additional Equipment: None  Intra-op Plan:   Post-operative Plan:   Informed Consent: I have reviewed the patients History and Physical, chart, labs and discussed the procedure including the risks, benefits and alternatives for the proposed anesthesia with the patient or authorized representative who has indicated his/her understanding and acceptance.     Dental Advisory Given  Plan Discussed with: Anesthesiologist  Anesthesia Plan Comments: (Labs checked- platelets confirmed with RN in room. Fetal heart tracing, per RN, reported to be stable enough for sitting procedure. Discussed epidural,  and patient consents to the procedure:  included risk of possible headache,backache, failed block, allergic reaction, and nerve injury. This patient was asked if she had any questions or concerns before the procedure started.)       Anesthesia Quick Evaluation

## 2022-10-29 NOTE — Anesthesia Procedure Notes (Signed)
Epidural Patient location during procedure: OB Start time: 10/29/2022 4:21 AM End time: 10/29/2022 4:26 AM  Staffing Anesthesiologist: Bethena Midget, MD  Preanesthetic Checklist Completed: patient identified, IV checked, site marked, risks and benefits discussed, surgical consent, monitors and equipment checked, pre-op evaluation and timeout performed  Epidural Patient position: sitting Prep: DuraPrep and site prepped and draped Patient monitoring: continuous pulse ox and blood pressure Approach: midline Location: L3-L4 Injection technique: LOR air  Needle:  Needle type: Tuohy  Needle gauge: 17 G Needle length: 9 cm and 9 Needle insertion depth: 8 cm Catheter type: closed end flexible Catheter size: 19 Gauge Catheter at skin depth: 14 cm Test dose: negative  Assessment Events: blood not aspirated, no cerebrospinal fluid, injection not painful, no injection resistance, no paresthesia and negative IV test

## 2022-10-29 NOTE — H&P (Signed)
Jenny Schwartz is a 26 y.o. female G2P1001 at 66 6/7 weeks (EDD 10/30/22 by LMP c/w 8 week [place]) presented to MAU with LOF hours prior at 1000am on 10/28/22  but did not present until 0100am on the following morning with mild contractions.   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 Social History:  reports that she has been smoking cigars. She has never used smokeless tobacco. She reports that she does not drink alcohol and does not use drugs.     Maternal Diabetes: No Genetic Screening: Normal NIPT low risk Maternal Ultrasounds/Referrals: Normal Fetal Ultrasounds or other Referrals:  Referred to Materal Fetal Medicine  and genetic counselor for +SMA carrier status Maternal Substance Abuse:  No Significant Maternal Medications:  None Significant Maternal Lab Results:  Group B Strep positive Number of Prenatal Visits:greater than 3 verified prenatal visits Other Comments:  None  Review of Systems Maternal Medical History:  Reason for admission: Rupture of membranes.   Contractions: Onset was 13-24 hours ago.   Frequency: regular.   Perceived severity is moderate.   Fetal activity: Perceived fetal activity is normal.   Prenatal complications: GBS +. 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   Dilation: 4 Effacement (%): 60 Station: -2 Exam by:: Lafonda Mosses Ansah-Mensah Blood pressure 111/61, pulse 89,  temperature 98.5 F (36.9 C), temperature source Oral, resp. rate 17, height 5\' 9"  (1.753 m), weight 110.2 kg, last menstrual period 01/23/2022, SpO2 99 %. Maternal Exam:  Uterine Assessment: Contraction strength is moderate.  Contraction frequency is regular.  Abdomen: Patient reports no abdominal tenderness. Fetal presentation: vertex Introitus: Normal vulva.   Physical Exam Constitutional:      Appearance: Normal appearance.  Cardiovascular:     Rate and Rhythm: Normal rate and regular rhythm.  Pulmonary:     Effort: Pulmonary effort is normal.  Abdominal:     Palpations: Abdomen is soft.  Genitourinary:    General: Normal vulva.  Neurological:     General: No focal deficit present.     Mental Status: She is alert.  Psychiatric:        Mood and Affect: Mood normal.     Prenatal labs: ABO, Rh: O/Positive/-- (05/30 0000) Antibody: Negative (05/30 0000) Rubella: Immune (05/30 0000) RPR: Nonreactive (05/30 0000)  HBsAg:   Neg 04/10/22 HIV: Non-reactive (05/30 0000)  GBS: Positive/-- (05/30 0000)  One hour GCT 105 Hgb AA AFP negative  Assessment/Plan: Pt admitted with SROM and latent phase labor after some delay presenting.  On PCN for +GBS And started on pitocin augmentation. Received epidural and FHR category 1.  09-09-1982  10/29/2022, 6:12 AM

## 2022-10-29 NOTE — MAU Note (Signed)
.  Jenny Schwartz is a 26 y.o. at [redacted]w[redacted]d here in MAU reporting: SROM this morning at 1000 clear fluid. Fern slide collected. Pt denies bleeding or bloody show. + fetal movement. Denies problems or complications with the pregnancy GBS +  Onset of complaint: 1000 Pain score: 9 Vitals:   10/29/22 0115  BP: 132/74  Pulse: 87  Resp: 18  Temp: 98 F (36.7 C)  SpO2: 99%     FHT:145bpm Lab orders placed from triage:  mau labor

## 2022-10-29 NOTE — Lactation Note (Signed)
This note was copied from a baby's chart. Lactation Consultation Note  Patient Name: [name] [id number] Date: 10/29/2022 Reason for consult: Initial assessment Age:25 hours   LC Note:  Mother is formula feeding only.   Maternal Data    Feeding Mother's Current Feeding Choice: Formula Nipple Type: Slow - flow  LATCH Score                    Lactation Tools Discussed/Used    Interventions    Discharge    Consult Status Consult Status: Complete    Jasman Murri R Yaneisy Wenz 10/29/2022, 3:10 PM

## 2022-10-30 LAB — CBC
HCT: 27.6 % — ABNORMAL LOW (ref 36.0–46.0)
Hemoglobin: 8.9 g/dL — ABNORMAL LOW (ref 12.0–15.0)
MCH: 25.9 pg — ABNORMAL LOW (ref 26.0–34.0)
MCHC: 32.2 g/dL (ref 30.0–36.0)
MCV: 80.5 fL (ref 80.0–100.0)
Platelets: 288 10*3/uL (ref 150–400)
RBC: 3.43 MIL/uL — ABNORMAL LOW (ref 3.87–5.11)
RDW: 19.2 % — ABNORMAL HIGH (ref 11.5–15.5)
WBC: 9.9 10*3/uL (ref 4.0–10.5)
nRBC: 0 % (ref 0.0–0.2)

## 2022-10-30 MED ORDER — FERROUS SULFATE 325 (65 FE) MG PO TABS
325.0000 mg | ORAL_TABLET | Freq: Every day | ORAL | Status: DC
  Administered 2022-10-30: 325 mg via ORAL
  Filled 2022-10-30: qty 1

## 2022-10-30 MED ORDER — FERROUS SULFATE 325 (65 FE) MG PO TABS: 325.0000 mg | ORAL_TABLET | Freq: Every day | ORAL | 3 refills | Status: DC

## 2022-10-30 NOTE — Progress Notes (Signed)
Patient is eating, ambulating, voiding.  Pain control is good.  Vitals:   10/29/22 1734 10/29/22 1945 10/29/22 2300 10/30/22 0554  BP: 111/72 103/60 94/60 105/70  Pulse: (!) 54 61 90 70  Resp: 18 18 16 18   Temp: 97.8 F (36.6 C) 97.8 F (36.6 C) 97.9 F (36.6 C) 97.7 F (36.5 C)  TempSrc: Oral Oral Oral Oral  SpO2:  100% 100% 99%  Weight:      Height:        Fundus firm Perineum without swelling.  Lab Results  Component Value Date   WBC 12.3 (H) 10/29/2022   HGB 9.6 (L) 10/29/2022   HCT 29.1 (L) 10/29/2022   MCV 79.5 (L) 10/29/2022   PLT 327 10/29/2022    --/--/O POS (12/18 0622)/RI  A/P Post partum day 1.  Routine care.  Expect d/c routine.  Iron.  06[birth year]-1996

## 2022-10-30 NOTE — Discharge Summary (Signed)
Postpartum Discharge Summary  Date of Service updated      Patient Name: Jenny Schwartz DOB: 1996-10-22 MRN: [id number]  Date of admission: 10/29/2022 Delivery date:10/29/2022  Delivering provider: Paula Compton  Date of discharge: 10/30/2022  Admitting diagnosis: Normal labor [O80, Z37.9] NSVD (normal spontaneous vaginal delivery) [O80] Intrauterine pregnancy: [redacted]w[redacted]d    Secondary diagnosis:  Principal Problem:   Normal labor Active Problems:   NSVD (normal spontaneous vaginal delivery)  Additional problems: None    Discharge diagnosis: Term Pregnancy Delivered                                              Post partum procedures: none Augmentation: Pitocin Complications: None  Hospital course: Onset of Labor With Vaginal Delivery      26y.o. yo [id number] admitted in Latent Labor on 10/29/2022. Labor course was complicated bynone  Membrane Rupture Time/Date: 10:00 AM ,10/28/2022   Delivery Method:Vaginal, Spontaneous  Episiotomy: None  Lacerations:    Patient had a postpartum course complicated by none.  She is ambulating, tolerating a regular diet, passing flatus, and urinating well. Patient is discharged home in stable condition on 10/30/22.  Newborn Data: Birth date:10/29/2022  Birth time:7:47 AM  Gender:Female  Living status:Living  Apgars:9 ,9  Weight:2910 g   Magnesium Sulfate received: No BMZ received: No Rhophylac:No MMR:No T-DaP:Given prenatally Flu: No Transfusion:No  Physical exam  Vitals:   10/29/22 1734 10/29/22 1945 10/29/22 2300 10/30/22 0554  BP: 111/72 103/60 94/60 105/70  Pulse: (!) 54 61 90 70  Resp: _0 Temp: 97.8 F (36.6 C) 97.8 F (36.6 C) 97.9 F (36.6 C) 97.7 F (36.5 C)  TempSrc: Oral Oral Oral Oral  SpO2:  100% 100% 99%  Weight:      Height:        Labs: Lab Results  Component Value Date   WBC 12.3 (H) 10/29/2022   HGB 9.6 (L) 10/29/2022   HCT 29.1 (L) 10/29/2022   MCV 79.5 (L) 10/29/2022    PLT 327 10/29/2022      Latest Ref Rng & Units 09/28/2020    8:47 AM  CMP  Glucose 70 - 99 mg/dL 89   BUN 6 - 20 mg/dL 10   Creatinine 0.44 - 1.00 mg/dL 0.71   Sodium 135 - 145 mmol/L 139   Potassium 3.5 - 5.1 mmol/L 3.4   Chloride 98 - 111 mmol/L 105   CO2 22 - 32 mmol/L 27   Calcium 8.9 - 10.3 mg/dL 8.7   Total Protein 6.5 - 8.1 g/dL 7.0   Total Bilirubin 0.3 - 1.2 mg/dL 0.3   Alkaline Phos 38 - 126 U/L 54   AST 15 - 41 U/L 13   ALT 0 - 44 U/L 9    Edinburgh Score:    10/29/2022    7:45 PM  Edinburgh Postnatal Depression Scale Screening Tool  I have been able to laugh and see the funny side of things. 0  I have looked forward with enjoyment to things. 0  I have blamed myself unnecessarily when things went wrong. 2  I have been anxious or worried for no good reason. 1  I have felt scared or panicky for no good reason. 1  Things have been getting on top of me. 0  I have been so unhappy that  I have had difficulty sleeping. 0  I have felt sad or miserable. 1  I have been so unhappy that I have been crying. 0  The thought of harming myself has occurred to me. 0  Edinburgh Postnatal Depression Scale Total 5      After visit meds:  Allergies as of 10/30/2022   No Known Allergies      Medication List     STOP taking these medications    aspirin EC 81 MG tablet       TAKE these medications    ferrous sulfate 325 (65 FE) MG tablet Take 1 tablet (325 mg total) by mouth daily with breakfast.   prenatal multivitamin Tabs tablet Take 2 tablets by mouth daily at 12 noon.         Discharge home in stable condition Infant Feeding:  ? Infant Disposition:home with mother Discharge instruction: per After Visit Summary and Postpartum booklet. Activity: Advance as tolerated. Pelvic rest for 6 weeks.  Diet: routine diet Anticipated Birth Control: Unsure Postpartum Appointment:4 weeks Additional Postpartum F/U:  none Future Appointments:No future  appointments. Follow up Visit:      10/30/2022 Daria Pastures, MD

## 2022-11-01 ENCOUNTER — Inpatient Hospital Stay (HOSPITAL_COMMUNITY): Admission: RE | Admit: 2022-11-01 | Payer: Medicaid Other | Source: Home / Self Care | Admitting: Obstetrics

## 2022-11-01 ENCOUNTER — Inpatient Hospital Stay (HOSPITAL_COMMUNITY): Payer: Medicaid Other

## 2022-11-03 ENCOUNTER — Encounter (HOSPITAL_COMMUNITY): Payer: Medicaid Other

## 2022-11-08 ENCOUNTER — Telehealth (HOSPITAL_COMMUNITY): Payer: Self-pay | Admitting: *Deleted

## 2022-11-08 NOTE — Telephone Encounter (Signed)
Hospital Discharge Follow-Up Call:  Patient reports that she is well.  She reports having a pressure sensation when she urinates, but upon reflection she notes that it only occurs when her bladder is quite full, otherwise she has no unusual symptoms with urination.  EPDS today was 2 and patient endorses this accurately reflects that she is doing well emotionally.  Patient says that baby is well and she has no concerns about baby's health.  They had a weight check at the pediatrician's office today.  Patient reports that baby sleeps in a bedside bassinet.  Reviewed ABCs of Safe Sleep.

## 2022-11-14 ENCOUNTER — Ambulatory Visit (INDEPENDENT_AMBULATORY_CARE_PROVIDER_SITE_OTHER): Payer: Medicaid Other | Admitting: Student

## 2022-11-14 ENCOUNTER — Encounter: Payer: Self-pay | Admitting: Student

## 2022-11-14 VITALS — BP 110/72 | HR 89 | Ht 69.0 in | Wt 229.0 lb

## 2022-11-14 DIAGNOSIS — Z30013 Encounter for initial prescription of injectable contraceptive: Secondary | ICD-10-CM | POA: Diagnosis present

## 2022-11-14 DIAGNOSIS — Z309 Encounter for contraceptive management, unspecified: Secondary | ICD-10-CM | POA: Insufficient documentation

## 2022-11-14 MED ORDER — MEDROXYPROGESTERONE ACETATE 150 MG/ML IM SUSY
150.0000 mg | PREFILLED_SYRINGE | Freq: Once | INTRAMUSCULAR | Status: AC
  Administered 2022-11-14: 150 mg via INTRAMUSCULAR

## 2022-11-14 NOTE — Patient Instructions (Addendum)
It was great to meet you today! We are happy to provide care for you.  You received your Depo-Provera today. Continue using condoms to prevent sexually transmitted infections.  We will plan to see you back for a nurse visitMarch 21st - April 4th for your next Depo shot.   If you have any questions or concerns, please feel free to call the clinic.   Have a wonderful day,  Dr. Orvis Brill [facility] [phone]

## 2022-11-14 NOTE — Assessment & Plan Note (Signed)
Patient desiring Depo-Provera for contraception. Has not been sexually active since delivery.  Encouraged her to continue to remain abstinent until cleared by her OB/GYN for sexual activity. Administered today without difficulty. Will return in 3 months for nurse visit for next schdeuled Depo

## 2022-11-14 NOTE — Progress Notes (Signed)
Patient here today to restart Depo Provera injection after pregnancy.    Last contraceptive appt was today  Depo given in [place] today.  Site unremarkable & patient tolerated injection.    Next injection due January 31, 2023 - February 14, 2023.  Reminder card given.    Christen Bame, CMA

## 2022-11-14 NOTE — Progress Notes (Signed)
    SUBJECTIVE:   CHIEF COMPLAINT / HPI:   Jenny Schwartz is a 27 year-old female here to establish care with PCP. She is newly postpartum having just had a baby almost 2 weeks ago. She feels like she is adjusting well to this.  She has a 32-year-old at home.  S She is interested in receiving Depo-Provera shot today, which she was on for years prior to becoming pregnant. She is currently formula feeding only.  Does not plan on breast-feeding. Has plenty of support at home.  Also, has a therapist that she sees when she needs to.  Vaping: On/off Alcohol: None   Allergies: None Daily medications: Iron and Prenatal Medical diagnoses: None Surgical hx: None Family hx:  None   PERTINENT  PMH / PSH: Reviewed  OBJECTIVE:   BP 110/72   Pulse 89   Ht 5\' 9"  (1.753 m)   Wt 229 lb (103.9 kg)   LMP 01/23/2022 (Approximate)   SpO2 100%   BMI 33.82 kg/m  General: Well-appearing, pleasant, no distress CV: Regular rate and rhythm respiratory:  Respiratory:Normal work of breathing on room air Psych: Pleasant.  Normal mood and affect.   ASSESSMENT/PLAN:   Contraceptive management Patient desiring Depo-Provera for contraception. Has not been sexually active since delivery.  Encouraged her to continue to remain abstinent until cleared by her OB/GYN for sexual activity. Administered today without difficulty. Will return in 3 months for nurse visit for next schdeuled Depo   Generally healthy 27 year-old here to establish care as well as contraceptive management as above. Plan to return for next visit in 1 year or sooner if needed.  Orvis Brill, [place]

## 2023-02-07 ENCOUNTER — Ambulatory Visit (INDEPENDENT_AMBULATORY_CARE_PROVIDER_SITE_OTHER): Payer: Medicaid Other | Admitting: Student

## 2023-02-07 ENCOUNTER — Other Ambulatory Visit (HOSPITAL_COMMUNITY)
Admission: RE | Admit: 2023-02-07 | Discharge: 2023-02-07 | Disposition: A | Discharge status: Home / Self Care | Payer: Medicaid Other | Source: Ambulatory Visit | Attending: Family Medicine | Admitting: Family Medicine

## 2023-02-07 VITALS — BP 115/75 | HR 88 | Ht 68.0 in | Wt 244.2 lb

## 2023-02-07 DIAGNOSIS — N898 Other specified noninflammatory disorders of vagina: Secondary | ICD-10-CM | POA: Insufficient documentation

## 2023-02-07 DIAGNOSIS — Z3009 Encounter for other general counseling and advice on contraception: Secondary | ICD-10-CM

## 2023-02-07 DIAGNOSIS — N939 Abnormal uterine and vaginal bleeding, unspecified: Secondary | ICD-10-CM | POA: Diagnosis not present

## 2023-02-07 LAB — POCT WET PREP (WET MOUNT)
Clue Cells Wet Prep Whiff POC: POSITIVE
Trichomonas Wet Prep HPF POC: ABSENT

## 2023-02-07 MED ORDER — METRONIDAZOLE 0.75 % VA GEL: 1.0000 | Freq: Every day | VAGINAL | 0 refills | Status: AC

## 2023-02-07 NOTE — Progress Notes (Signed)
  SUBJECTIVE:   CHIEF COMPLAINT / HPI:   STI check - recently became sexually active with a new partner, wants to be checked for STIs. Does not wish to be checked for RPR/HIV.  - preferred gender of partner: male - Last sexual encounter: 02/05/23 - Contraception: Depo. Does not wish to get another depo injection. Will use condoms and pull out method.  Symptoms include: none  Last depo injection given 11/14/22. Notes that with the recent injection, she has had continuous vaginal bleeding. She was on depo for years (4) prior to the most recent pregnancy which is why she continued after her most recent pregnancy. Some days she would need a pad and not even need to change the pad, some weeks she would need to change the pad every 1-2 hours. Varied greatly. Endorses cramps with the bleeding. Notes previously her energy level was low but this last week has been better. Denies loss of consciousness. She is still taking her iron supplement. Notes that in this last week she has not had any bleeding. Labor was uncomplicated, did not require any stitching. No fevers. Feels otherwise well.   PERTINENT  PMH / PSH:   Patient Care Team: Orvis Brill, DO as PCP - General (Family Medicine) Rowland Lathe, MD as PCP - OBGYN (Obstetrics and Gynecology) OBJECTIVE:  BP 115/75   Pulse 88   Ht 5\' 8"  (1.727 m)   Wt 244 lb 3.2 oz (110.8 kg)   SpO2 100%   Breastfeeding Unknown   BMI 37.13 kg/m  Pelvic exam: normal external genitalia, vulva, vagina, cervix, uterus and adnexa, VAGINA: vaginal discharge - white and creamy; no blood or brown discharge appreciated.   ASSESSMENT/PLAN:  Vaginal discharge Assessment & Plan: Wet mount positive for BV.  MetroGel intravaginally daily x 5 days.   Orders: -     POCT Wet Prep [facility]) -     Cervicovaginal ancillary only -     metroNIDAZOLE; Place 1 Applicatorful vaginally at bedtime for 5 days.  Dispense: 50 g; Refill: 0  Vaginal bleeding Assessment &  Plan: Abnormal history of inconsistent bleeding up to 10 weeks after pregnancy.  While some bleeding may be normal after pregnancy, I would not generally suspect this given she has received the Depo injection.  Although it is reassuring that she has not bled for the last week.  Will check CBC given low hemoglobin in the past, continue iron supplement.  Follow-up in 4 weeks and additionally continue discussion for contraception.  Orders: -     CBC  Encounter for other general counseling or advice on contraception Assessment & Plan: Does not want to continue Depo today, I have made patient aware that condoms and pullout method are not consistent for pregnancy prevention and she will need to be very diligent with this.  She declined and having further discussion regarding contraceptive methods.  Return to continue conversation in 4 weeks.   Patient left prior to giving AVS and return precaution instructions.  I did call her about 10 to 15 minutes after appointment to counsel her that I prescribed MetroGel for BV and further counseling regarding return plan to discuss vaginal bleeding and making her aware of being very diligent with her pregnancy prevention.  See separate problem list for more detail. Return in about 4 weeks (around 03/07/2023). Wells Guiles, DO 02/07/2023, 4:12 PM PGY-2, [place]

## 2023-02-07 NOTE — Assessment & Plan Note (Signed)
Does not want to continue Depo today, I have made patient aware that condoms and pullout method are not consistent for pregnancy prevention and she will need to be very diligent with this.  She declined and having further discussion regarding contraceptive methods.  Return to continue conversation in 4 weeks.

## 2023-02-07 NOTE — Patient Instructions (Addendum)
It was great to see you today! Thank you for choosing [facility] for your primary care. Jenny Schwartz was seen for contraceptive discussion.  Today we addressed: I have prescribed you MetroGel to use for bacterial vaginosis.  Please be diligent with using condoms, the pullout method is not consistent for pregnancy prevention.  I would recommend returning in 4 weeks to continue discussion for vaginal bleeding and contraceptive methods.  If you haven't already, sign up for My Chart to have easy access to your labs results, and communication with your primary care physician.  We are checking some labs today. If they are abnormal, I will call you. If they are normal, I will send you a MyChart message (if it is active) or a letter in the mail. If you do not hear about your labs in the next 2 weeks, please call the office. Call the clinic at [phone] if your symptoms worsen or you have any concerns.  You should return to our clinic Return in about 4 weeks (around 03/07/2023). Please arrive 15 minutes before your appointment to ensure smooth check in process.  We appreciate your efforts in making this happen.  Thank you for allowing me to participate in your care, Wells Guiles, DO 02/07/2023, 3:52 PM PGY-2, [place]

## 2023-02-07 NOTE — Assessment & Plan Note (Signed)
Wet mount positive for BV.  MetroGel intravaginally daily x 5 days.

## 2023-02-07 NOTE — Assessment & Plan Note (Signed)
Abnormal history of inconsistent bleeding up to 10 weeks after pregnancy.  While some bleeding may be normal after pregnancy, I would not generally suspect this given she has received the Depo injection.  Although it is reassuring that she has not bled for the last week.  Will check CBC given low hemoglobin in the past, continue iron supplement.  Follow-up in 4 weeks and additionally continue discussion for contraception.

## 2023-02-08 LAB — CBC
Hematocrit: 35.7 % (ref 34.0–46.6)
Hemoglobin: 11.5 g/dL (ref 11.1–15.9)
MCH: 26.3 pg — ABNORMAL LOW (ref 26.6–33.0)
MCHC: 32.2 g/dL (ref 31.5–35.7)
MCV: 82 fL (ref 79–97)
Platelets: 331 10*3/uL (ref 150–450)
RBC: 4.37 x10E6/uL (ref 3.77–5.28)
RDW: 15.2 % (ref 11.7–15.4)
WBC: 9.8 10*3/uL (ref 3.4–10.8)

## 2023-02-08 LAB — CERVICOVAGINAL ANCILLARY ONLY
Chlamydia: NEGATIVE
Comment: NEGATIVE
Comment: NORMAL
Neisseria Gonorrhea: NEGATIVE

## 2023-05-09 ENCOUNTER — Encounter: Payer: Self-pay | Admitting: Family Medicine

## 2023-05-09 ENCOUNTER — Ambulatory Visit (INDEPENDENT_AMBULATORY_CARE_PROVIDER_SITE_OTHER): Payer: Medicaid Other | Admitting: Family Medicine

## 2023-05-09 ENCOUNTER — Other Ambulatory Visit (HOSPITAL_COMMUNITY)
Admission: RE | Admit: 2023-05-09 | Discharge: 2023-05-09 | Disposition: A | Discharge status: Home / Self Care | Payer: Medicaid Other | Source: Ambulatory Visit | Attending: Family Medicine | Admitting: Family Medicine

## 2023-05-09 VITALS — BP 110/79 | HR 81 | Ht 69.0 in | Wt 251.1 lb

## 2023-05-09 DIAGNOSIS — N926 Irregular menstruation, unspecified: Secondary | ICD-10-CM | POA: Diagnosis not present

## 2023-05-09 DIAGNOSIS — Z3200 Encounter for pregnancy test, result unknown: Secondary | ICD-10-CM

## 2023-05-09 DIAGNOSIS — Z113 Encounter for screening for infections with a predominantly sexual mode of transmission: Secondary | ICD-10-CM

## 2023-05-09 NOTE — Assessment & Plan Note (Signed)
-  initially thought to be due to contraception, may benefit from switching birth control, PCP appointment scheduled in a few days -in the meantime to rule out STIs as cause, cervicoancillary testing pending along with HIV and RPR testing as patient was agreeable -low suspicion of pregnancy however called patient and she is wanting a pregnancy test. Upreg pended for the future as she is not able to come in until early next week -up to date on PAP smear, next due 2026 -follow up with PCP next week

## 2023-05-09 NOTE — Patient Instructions (Addendum)
It was great seeing you today!  Today we performed a full screening for any sexually transmitted infections. Please make sure to follow up with your PCP as scheduled.   You are not due for your next PAP smear until 2026.   Please follow up at your next scheduled appointment, if anything arises between now and then, please don't hesitate to contact our office.   Thank you for allowing [place] to be a part of your medical care!  Thank you, Dr. Robyne Peers

## 2023-05-09 NOTE — Progress Notes (Signed)
    SUBJECTIVE:   CHIEF COMPLAINT / HPI:   Patient presents desiring STI testing, she denies any new partners but wants to rule out any possible infections as she has been experiencing irregular bleeding for the past few months. This started when she was on depo, her last injection was January 2024. Her PCP recommended to stop the depo to see if this would resolve the bleeding but this did not seem to make a significant difference. Describes her cycles as regular, usually lasts about 6-7 days but she has been spotting for other days with the longest period of continuous bleeding to be 12 days. Spotting has occurred intermittently for the most part of the past few months. LMP started today. Denies fever, chills, vaginal discharge, dysuria, abdominal or pelvic pain. She does not want a speculum exam.   OBJECTIVE:   BP 110/79   Pulse 81   Ht 5\' 9"  (1.753 m)   Wt 251 lb 2 oz (113.9 kg)   LMP 05/09/2023   SpO2 100%   BMI 37.08 kg/m   General: Patient well-appearing, in no acute distress. Resp: normal work of breathing noted GU: normal labia, no external lesions or rashes noted, mild bleeding noted on swab  GU exam performed in the presence of chaperone, [employer], CMA.   ASSESSMENT/PLAN:   Irregular menstrual bleeding -initially thought to be due to contraception, may benefit from switching birth control, PCP appointment scheduled in a few days -in the meantime to rule out STIs as cause, cervicoancillary testing pending along with HIV and RPR testing as patient was agreeable -low suspicion of pregnancy however called patient and she is wanting a pregnancy test. Upreg pended for the future as she is not able to come in until early next week -up to date on PAP smear, next due 2026 -follow up with PCP next week   Blane Worthington Robyne Peers, DO [facility]

## 2023-05-10 LAB — CERVICOVAGINAL ANCILLARY ONLY
Bacterial Vaginitis (gardnerella): POSITIVE — AB
Candida Glabrata: NEGATIVE
Candida Vaginitis: NEGATIVE
Chlamydia: NEGATIVE
Comment: NEGATIVE
Comment: NEGATIVE
Comment: NEGATIVE
Comment: NEGATIVE
Comment: NEGATIVE
Comment: NORMAL
Neisseria Gonorrhea: NEGATIVE
Trichomonas: NEGATIVE

## 2023-05-10 LAB — HIV ANTIBODY (ROUTINE TESTING W REFLEX): HIV Screen 4th Generation wRfx: NONREACTIVE

## 2023-05-10 LAB — RPR: RPR Ser Ql: NONREACTIVE

## 2023-05-14 ENCOUNTER — Ambulatory Visit: Payer: Medicaid Other | Admitting: Student

## 2023-08-09 ENCOUNTER — Ambulatory Visit: Payer: Medicaid Other

## 2023-08-23 IMAGING — US US OB < 14 WEEKS - US OB TV
1 series · 14 of 28 positions shown · non-contrast
Comparison: None.
COMPARISON: None.

Addendum:
CLINICAL DATA: Vaginal bleeding



[Series 1: us ob < 14 weeks - us ob tv · 14 of 39 slices shown]
[im 2/39]
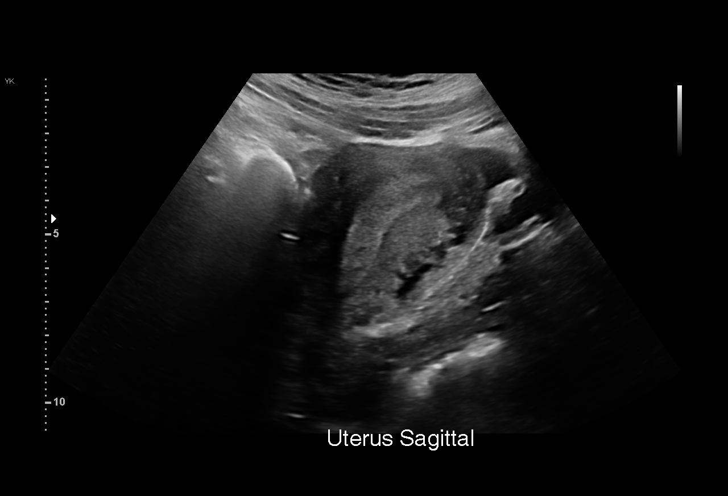
[im 5/39]
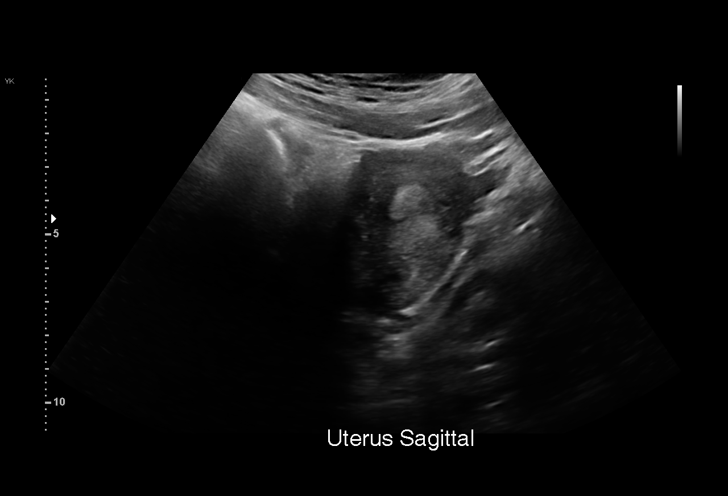
[im 8/39]
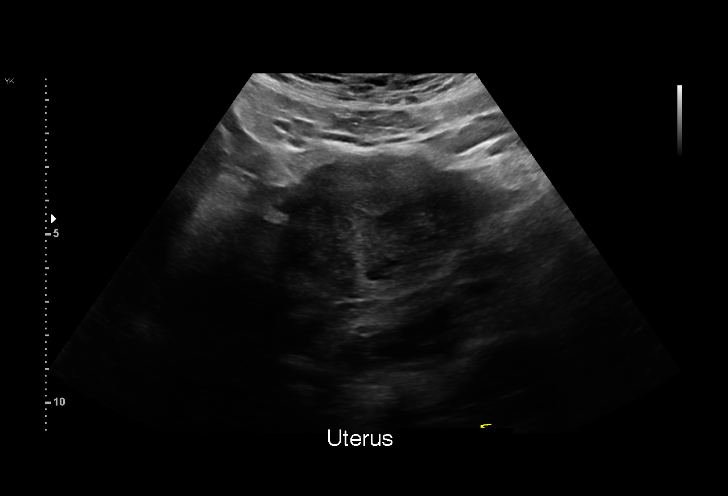
[im 10/39]
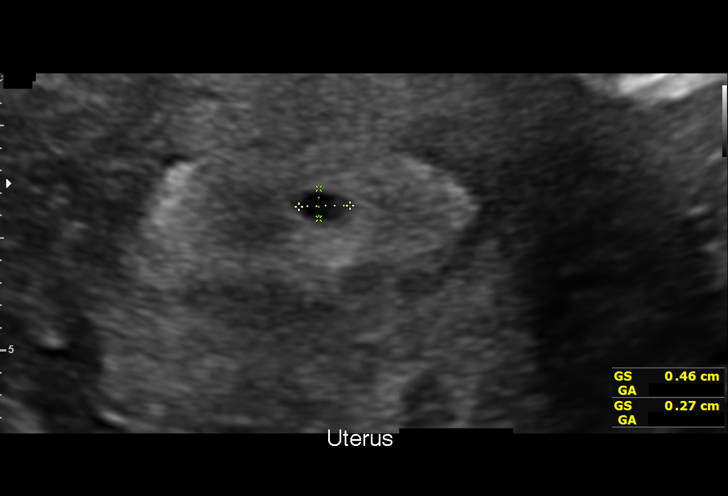
[im 13/39]
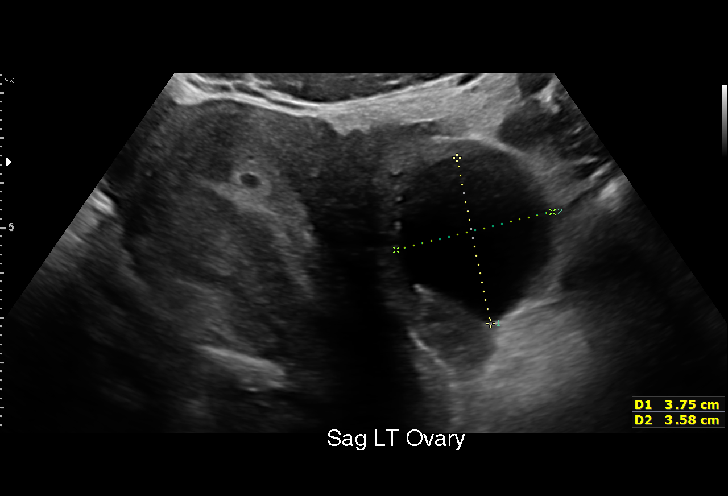
[im 16/39]
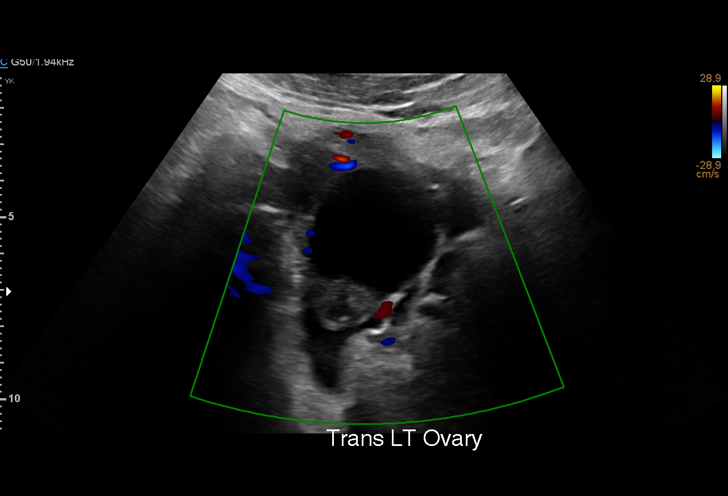
[im 19/39]
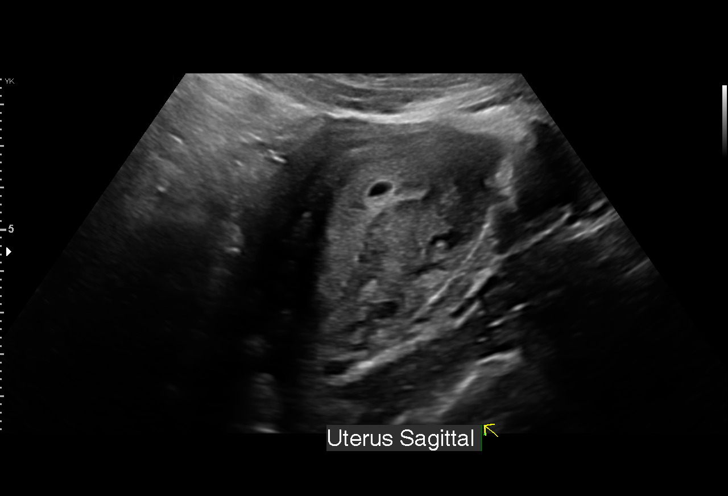
[im 22/39]
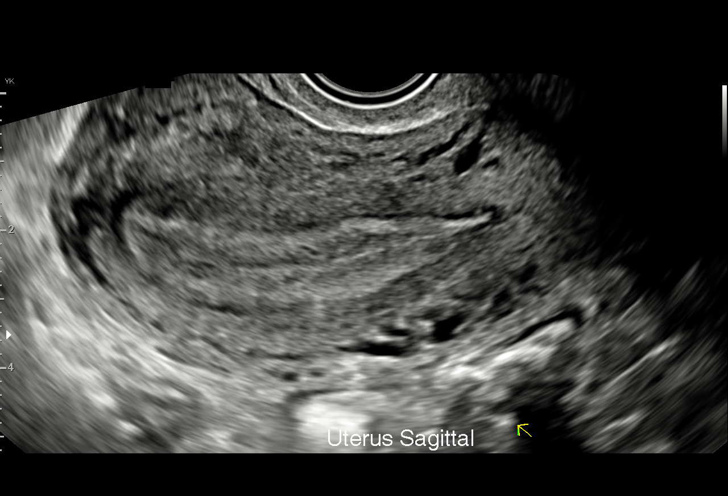
[im 24/39]
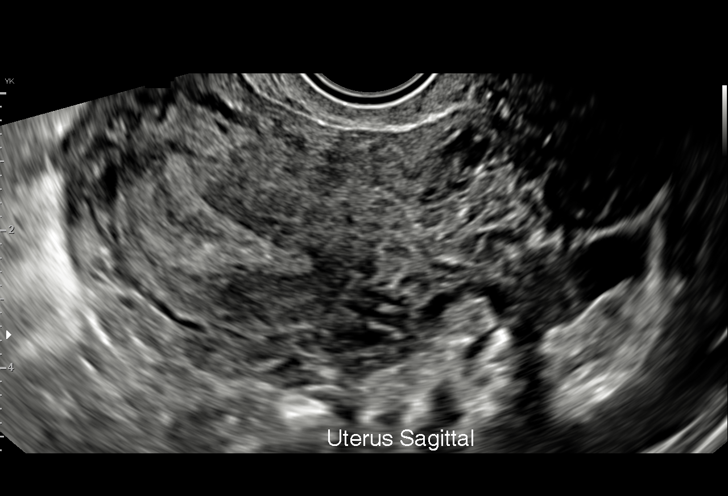
[im 27/39]
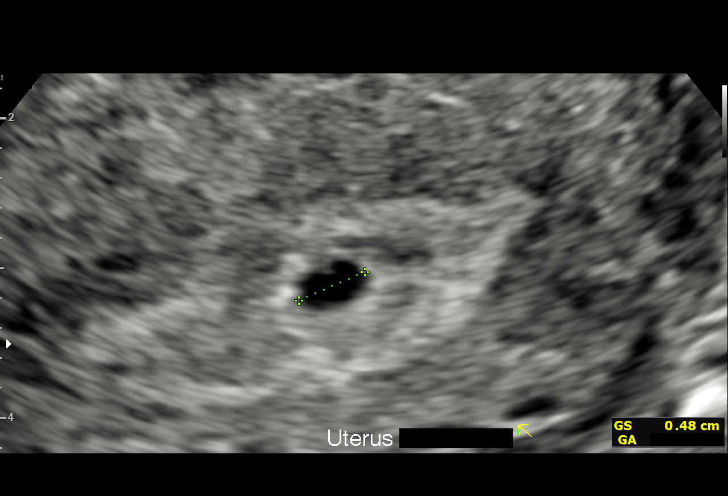
[im 30/39]
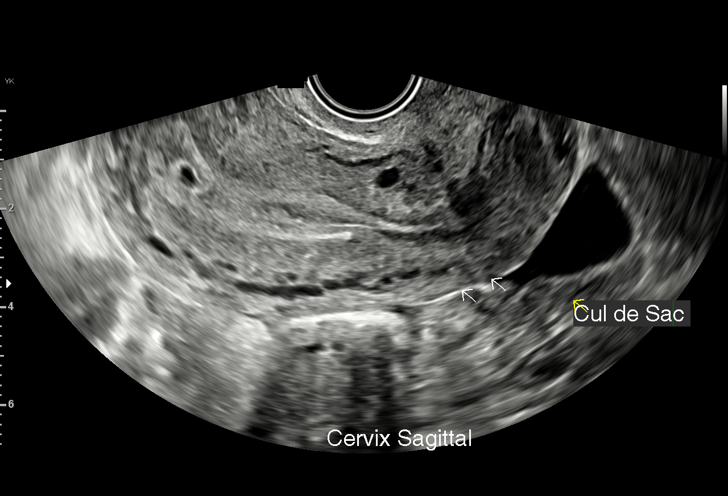
[im 33/39]
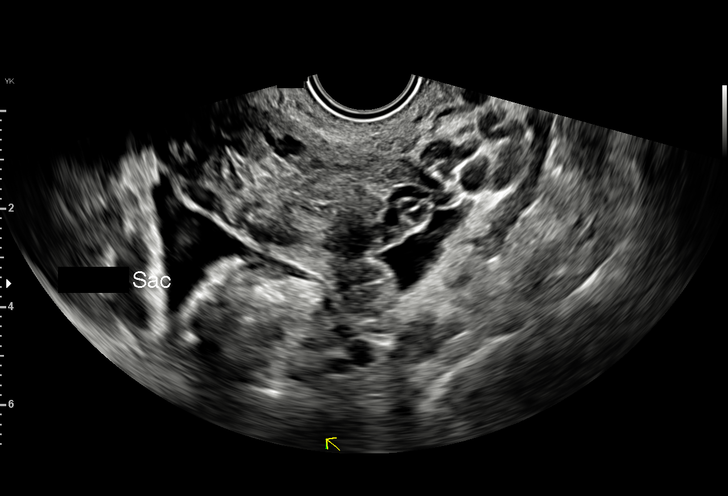
[im 36/39]
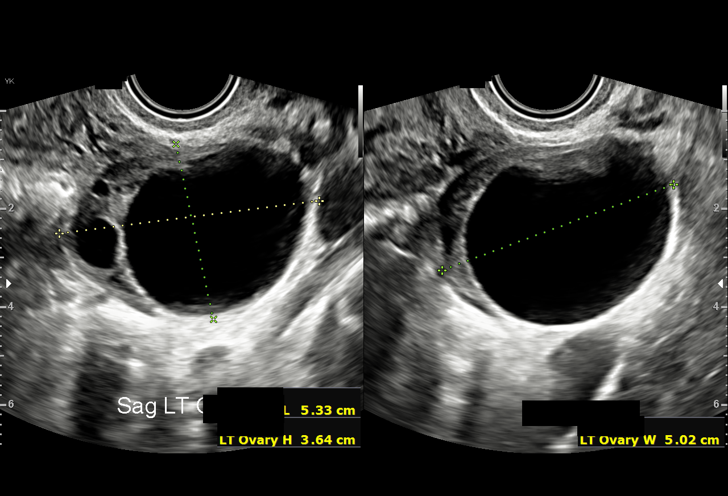
[im 39/39]
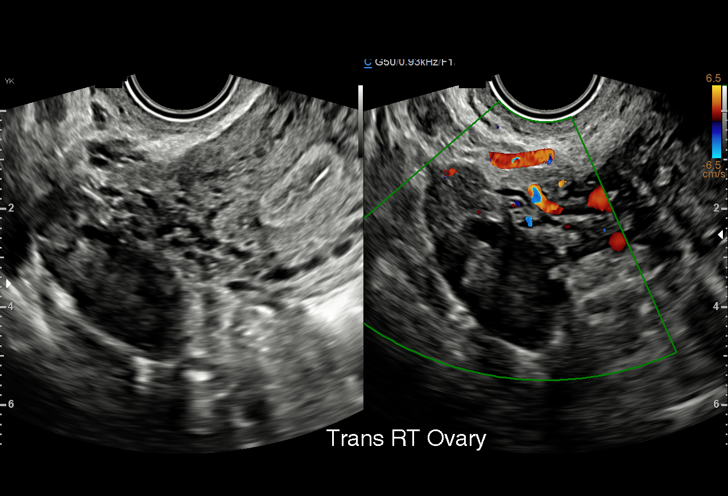

[14 of 28 positions shown; findings below may reference images not displayed]

FINDINGS: Intrauterine gestational sac: Single

Yolk sac:  Seen

Embryo:  Not seen

Cardiac Activity: Not seen

MSD: 4.6 mm   5 w   1 d

Subchorionic hemorrhage:  None visualized.

Maternal uterus/adnexae: There is 4.4 cm simple appearing cyst in
the left ovary, possibly functional ovarian cyst. Small amount of
free fluid is seen in the pelvis.

Pulsed Doppler evaluation of both ovaries demonstrates normal
appearing low-resistance arterial and venous waveforms.
IMPRESSION: There is a gestational sac containing yolk sac within the fundus of
the uterus. There is no demonstrable fetal pole or fetal cardiac
activity. By mean sac diameter measurement, estimated gestational
age is 5 weeks 1 day. Serial HCG estimations and follow-up sonogram
as warranted should be considered.

There is 4.4 cm simple appearing cyst in the left adnexa suggesting
functional left ovarian cyst. Small amount of free fluid in
cul-de-sac may suggest recent rupture of ovarian cyst or follicle.

ADDENDUM:
The following should be changed in the technique and body of the
original report. Pulsed Doppler of the ovaries was not performed in
the current study. Color Doppler examination of both adnexal regions
was performed in the current study.

*** End of Addendum ***
FINDINGS: Intrauterine gestational sac: Single

Yolk sac:  Seen

Embryo:  Not seen

Cardiac Activity: Not seen

MSD: 4.6 mm   5 w   1 d

Subchorionic hemorrhage:  None visualized.

Maternal uterus/adnexae: There is 4.4 cm simple appearing cyst in
the left ovary, possibly functional ovarian cyst. Small amount of
free fluid is seen in the pelvis.

Pulsed Doppler evaluation of both ovaries demonstrates normal
appearing low-resistance arterial and venous waveforms.
IMPRESSION: There is a gestational sac containing yolk sac within the fundus of
the uterus. There is no demonstrable fetal pole or fetal cardiac
activity. By mean sac diameter measurement, estimated gestational
age is 5 weeks 1 day. Serial HCG estimations and follow-up sonogram
as warranted should be considered.

There is 4.4 cm simple appearing cyst in the left adnexa suggesting
functional left ovarian cyst. Small amount of free fluid in
cul-de-sac may suggest recent rupture of ovarian cyst or follicle.

## 2024-02-21 ENCOUNTER — Ambulatory Visit: Admitting: Student

## 2024-02-21 VITALS — BP 120/86 | HR 92 | Ht 69.0 in | Wt 261.0 lb

## 2024-02-21 DIAGNOSIS — R350 Frequency of micturition: Secondary | ICD-10-CM

## 2024-02-21 LAB — POCT URINALYSIS DIP (MANUAL ENTRY)
Bilirubin, UA: NEGATIVE
Glucose, UA: NEGATIVE mg/dL
Ketones, POC UA: NEGATIVE mg/dL
Leukocytes, UA: NEGATIVE
Nitrite, UA: NEGATIVE
Protein Ur, POC: >=300 mg/dL — AB
Spec Grav, UA: >=1.03 — AB (ref 1.010–1.025)
Urobilinogen, UA: 0.2 U/dL
pH, UA: 6.5 (ref 5.0–8.0)

## 2024-02-21 LAB — POCT GLYCOSYLATED HEMOGLOBIN (HGB A1C): Hemoglobin A1C: 5.4 % (ref 4.0–5.6)

## 2024-02-21 LAB — POCT UA - MICROSCOPIC ONLY

## 2024-02-21 LAB — POCT URINE PREGNANCY: Preg Test, Ur: NEGATIVE

## 2024-02-21 MED ORDER — PHENAZOPYRIDINE HCL 95 MG PO TABS: 95.0000 mg | ORAL_TABLET | Freq: Three times a day (TID) | ORAL | 0 refills | Status: DC | PRN

## 2024-02-21 NOTE — Progress Notes (Signed)
    SUBJECTIVE:   CHIEF COMPLAINT / HPI:   Urinary Frequency and Pressure:  The patient, with a recent negative sexually transmitted disease screening, presents with new onset lower abdominal pain and pressure, particularly noticeable during urination. The symptoms started the previous night and have been persistent, causing discomfort and interrupting sleep. The patient denies any burning sensation during urination, fever, or changes in bowel movements. The patient also reports having taken two home pregnancy tests recently, both of which returned invalid results. The patient is concerned about a possible urinary tract infection, given the similarity of the current symptoms to those experienced during previous UTIs. The patient also wishes to rule out a potential pregnancy.  PERTINENT  PMH / PSH: Reviewed   OBJECTIVE:   BP 120/86   Pulse 92   Ht 5\' 9"  (1.753 m)   Wt 261 lb (118.4 kg)   LMP 02/13/2024   SpO2 100%   BMI 38.54 kg/m   General: Alert and oriented in no apparent distress Heart: Regular rate treat and rhythm with no murmurs appreciated Lungs: CTA bilaterally, no wheezing Abdomen: Bowel sounds present, no abdominal pain Skin: Warm and dry   ASSESSMENT/PLAN:   Assessment & Plan Urine frequency Symptoms and urine dipstick suggest presence of blood without nitrites or leuks. No systemic symptoms. Recent negative STD tests. Concern for hematuria, basic labs with CBC and CMP as well as A1C, has protein in urine as well. Not on menstrual. Preg negative  - Order urine culture. - Pyridium medication if culture negative. - Follow up labs  - return in 3 weeks to assess cessation of hematuria      Alfredo Martinez, MD [facility]

## 2024-02-21 NOTE — Patient Instructions (Addendum)
 It was great to see you today! Thank you for choosing [facility] for your primary care.  Today we addressed: - I have ordered Pyridium for bladder irritation three times daily for pain  - I would like to check labs given the blood in your urine  - Return in 4 weeks to recheck urine  - I will send for culture as well   If you haven't already, sign up for My Chart to have easy access to your labs results, and communication with your primary care physician.   Please arrive 15 minutes before your appointment to ensure smooth check in process.  We appreciate your efforts in making this happen.  Thank you for allowing me to participate in your care, Alfredo Martinez, MD 02/21/2024, 10:17 AM PGY-3, [facility]

## 2024-02-22 LAB — COMPREHENSIVE METABOLIC PANEL WITH GFR
ALT: 8 IU/L (ref 0–32)
AST: 14 IU/L (ref 0–40)
Albumin: 3.8 g/dL — ABNORMAL LOW (ref 4.0–5.0)
Alkaline Phosphatase: 71 IU/L (ref 44–121)
BUN/Creatinine Ratio: 13 (ref 9–23)
BUN: 9 mg/dL (ref 6–20)
Bilirubin Total: <0.2 mg/dL (ref 0.0–1.2)
CO2: 24 mmol/L (ref 20–29)
Calcium: 9.1 mg/dL (ref 8.7–10.2)
Chloride: 102 mmol/L (ref 96–106)
Creatinine, Ser: 0.67 mg/dL (ref 0.57–1.00)
Globulin, Total: 2.5 g/dL (ref 1.5–4.5)
Glucose: 87 mg/dL (ref 70–99)
Potassium: 4.3 mmol/L (ref 3.5–5.2)
Sodium: 139 mmol/L (ref 134–144)
Total Protein: 6.3 g/dL (ref 6.0–8.5)
eGFR: 123 mL/min/{1.73_m2} (ref >59)

## 2024-02-22 LAB — CBC WITH DIFFERENTIAL/PLATELET
Basophils Absolute: 0 10*3/uL (ref 0.0–0.2)
Basos: 0 %
EOS (ABSOLUTE): 0.1 10*3/uL (ref 0.0–0.4)
Eos: 1 %
Hematocrit: 38.5 % (ref 34.0–46.6)
Hemoglobin: 12.4 g/dL (ref 11.1–15.9)
Immature Grans (Abs): 0 10*3/uL (ref 0.0–0.1)
Immature Granulocytes: 0 %
Lymphocytes Absolute: 2.3 10*3/uL (ref 0.7–3.1)
Lymphs: 25 %
MCH: 27.3 pg (ref 26.6–33.0)
MCHC: 32.2 g/dL (ref 31.5–35.7)
MCV: 85 fL (ref 79–97)
Monocytes Absolute: 0.6 10*3/uL (ref 0.1–0.9)
Monocytes: 7 %
Neutrophils Absolute: 6.2 10*3/uL (ref 1.4–7.0)
Neutrophils: 67 %
Platelets: 440 10*3/uL (ref 150–450)
RBC: 4.54 x10E6/uL (ref 3.77–5.28)
RDW: 14.1 % (ref 11.7–15.4)
WBC: 9.3 10*3/uL (ref 3.4–10.8)

## 2024-02-23 ENCOUNTER — Encounter: Payer: Self-pay | Admitting: Student

## 2024-02-23 LAB — URINE CULTURE

## 2024-03-05 ENCOUNTER — Ambulatory Visit: Admitting: Student

## 2024-04-21 ENCOUNTER — Encounter: Payer: Self-pay | Admitting: *Deleted

## 2024-09-02 ENCOUNTER — Ambulatory Visit (INDEPENDENT_AMBULATORY_CARE_PROVIDER_SITE_OTHER): Admitting: Student

## 2024-09-02 ENCOUNTER — Ambulatory Visit: Payer: Self-pay | Admitting: Student

## 2024-09-02 VITALS — BP 105/68 | HR 99 | Ht 68.0 in | Wt 262.0 lb

## 2024-09-02 DIAGNOSIS — N926 Irregular menstruation, unspecified: Secondary | ICD-10-CM | POA: Diagnosis present

## 2024-09-02 DIAGNOSIS — Z8659 Personal history of other mental and behavioral disorders: Secondary | ICD-10-CM

## 2024-09-02 LAB — POCT URINE PREGNANCY: Preg Test, Ur: NEGATIVE

## 2024-09-02 NOTE — Progress Notes (Signed)
    SUBJECTIVE:   CHIEF COMPLAINT / HPI:   Discussed the use of AI scribe software for clinical note transcription with the patient, who gave verbal consent to proceed.  History of Present Illness Jenny Schwartz is a 28 year old female who presents with irregular menstrual bleeding.  Abnormal uterine bleeding - Irregular menstrual bleeding with two episodes in one month: September 15th-24th and September 30th-October 5th - No bleeding since October 5th - Menstrual cycles typically heavy, painful, and prolonged in duration - Menstrual cycle tracked using a flow app - No current use of birth control - Last Depo-Provera  injection on November 14, 2022, resulting in continuous bleeding for three months - No further Depo-Provera  doses after initial injection - Sexually active with partner without protection - Home pregnancy test performed after being three days late was negative  Psychological stress and mood symptoms - Significant psychosocial stressors including family death on 2025/09/30and funeral on October 4th - Feelings of depression and stress - No current or prior medication for depression or anxiety - Family concerns regarding possible bipolar disorder  OBJECTIVE:   BP 105/68   Pulse 99   Ht 5' 8 (1.727 m)   Wt 262 lb (118.8 kg)   LMP 08/11/2024   SpO2 97%   Breastfeeding No   BMI 39.84 kg/m    General: NAD, pleasant Cardio: RRR, no MRG. Cap Refill <2s. Respiratory: CTAB, normal wob on RA Skin: Warm and dry  ASSESSMENT/PLAN:   Assessment & Plan Missed menses - Perform urine pregnancy test today. - Consider lab work including thyroid function tests, prolactin levels, FSH, and LH if irregular bleeding persists. - Consider pelvic ultrasound if irregular bleeding continues to rule out fibroids or polyps. History of depressive symptoms - Appointment scheduled to discuss in detail. No SI/HI today.    Personal injury documentation: Patient brings her  ~65-year-old (Dream Macario) with her to appointment.  Unfortunately, child was running around the room and appears to trip on her shoelace hitting her face on the floor.  Medical attention was provided by provider (Dr. Howell) and CMA Rosalene.  Teeth intact, bleeding from gumline-which achieved hemostasis with direct pressure w/ gauze.  Child well-appearing prior to leaving.  Gladis Howell, DO [facility]

## 2024-09-02 NOTE — Patient Instructions (Signed)
 It was great to see you! Thank you for allowing me to participate in your care!   I recommend that you always bring your medications to each appointment as this makes it easy to ensure we are on the correct medications and helps us  not miss when refills are needed.  Our plans for today:  - Please follow-up if menstrual cycle remains irregular - We are checking some labs today, I will call you if they are abnormal will send you a MyChart message or a letter if they are normal.  If you do not hear about your labs in the next 2 weeks please let us  know.  Take care and seek immediate care sooner if you develop any concerns. Please remember to show up 15 minutes before your scheduled appointment time!  Gladis Church, DO [facility]

## 2024-09-04 ENCOUNTER — Ambulatory Visit: Payer: Self-pay | Admitting: Student

## 2024-09-24 NOTE — Progress Notes (Unsigned)
    SUBJECTIVE:   CHIEF COMPLAINT / HPI:   Vaginal Irritation - Vaginal discharge (no color or odor, but clumpy) and irritation for 4 days - Urinary urgency and frequency, no dysuria - Last sexual encounter was Monday, unprotected, same partner 3 years - Tried a new body soap since last Friday - LMP: Oct 29th - Patient doesn't want to be on a contraceptive, trying to conceive   PERTINENT  PMH / PSH: Anemia  OBJECTIVE:   BP 104/63   Pulse 89   Ht 5' 8 (1.727 m)   Wt 265 lb 9.6 oz (120.5 kg)   LMP 09/09/2024   SpO2 96%   BMI 40.38 kg/m   General: Awake and Alert in NAD HEENT: NCAT. Sclera anicteric. No rhinorrhea. Respiratory: Normal WOB on RA.  Extremities: Able to move all extremities. No BLE edema, no deformities or significant joint findings. Skin: Warm and dry. No abrasions or rashes noted. Neuro: A&Ox3. No focal neurological deficits. F GU: Normally developed genitalia with no external lesions or eruptions. Vagina and cervix show no lesions, inflammation, or tenderness.  White clumpy discharge noted.  Chaperoned by CMA Stacey Reaves.   ASSESSMENT/PLAN:   Assessment & Plan Urinary urgency Suprapubic discomfort, urgency, and frequency with no dysuria for the past 4 days. - UA dipstick performed and urine culture ordered, will follow results - Advised proper hydration and hygiene Screening examination for STD (sexually transmitted disease) Last sexual encounter 11/10.  Has been having some clumpy white vaginal discharge, no pruritus or excoriations noted on exam.  Most likely Candida based on exam. Pregnancy test negative today. - Diflucan 150 mg x 1, second dose after 3 days if symptoms persist - STD Prevention and Safe Sex Practices included in AVS   Advised patient to follow-up with PCP to discuss vaccines, patient is from [place]  and [facility] is not up-to-date. May require immunity testing.  Kathrine Melena, DO [facility]

## 2024-09-25 ENCOUNTER — Encounter: Payer: Self-pay | Admitting: Family Medicine

## 2024-09-25 ENCOUNTER — Ambulatory Visit (INDEPENDENT_AMBULATORY_CARE_PROVIDER_SITE_OTHER): Admitting: Family Medicine

## 2024-09-25 ENCOUNTER — Other Ambulatory Visit (HOSPITAL_COMMUNITY)
Admission: RE | Admit: 2024-09-25 | Discharge: 2024-09-25 | Disposition: A | Source: Ambulatory Visit | Attending: Family Medicine | Admitting: Family Medicine

## 2024-09-25 VITALS — BP 104/63 | HR 89 | Ht 68.0 in | Wt 265.6 lb

## 2024-09-25 DIAGNOSIS — Z113 Encounter for screening for infections with a predominantly sexual mode of transmission: Secondary | ICD-10-CM | POA: Diagnosis present

## 2024-09-25 DIAGNOSIS — R3915 Urgency of urination: Secondary | ICD-10-CM

## 2024-09-25 LAB — POCT URINE PREGNANCY: Preg Test, Ur: NEGATIVE

## 2024-09-25 LAB — POCT URINALYSIS DIP (CLINITEK)
Bilirubin, UA: NEGATIVE
Glucose, UA: NEGATIVE mg/dL
Ketones, POC UA: NEGATIVE mg/dL
Nitrite, UA: NEGATIVE
POC PROTEIN,UA: >=300 — AB
Spec Grav, UA: >=1.03 — AB (ref 1.010–1.025)
Urobilinogen, UA: 0.2 U/dL
pH, UA: 6 (ref 5.0–8.0)

## 2024-09-25 MED ORDER — FLUCONAZOLE 150 MG PO TABS: 150.0000 mg | ORAL_TABLET | Freq: Once | ORAL | 0 refills | Status: AC

## 2024-09-25 NOTE — Patient Instructions (Addendum)
 It was great to see you today! Thank you for choosing [facility] for your primary care. Jenny Schwartz was seen for vaginal irritation.  Today we addressed: Vaginal irritation - you likely have a yeast infection, we will treat you with Diflucan 150 mg x1, we will send in two doses so if your symptoms persist after the first dose, you can take the second on day 3.  Pregnancy test negative, consider discussing contraception options with your PCP at your next visit. Urinalysis - showed some dehydration and possible signs of infection, we will perform a urine culture to see if you have a true UTI and treat according to antibiotic sensitivities.  STD Prevention and Safe Sex Practices - Important to note: - STDs can occur without symptoms - Pulling out is not an effective form of protection from pregnancy or STDs - Oral sex can transmit STDs too - Abstinence or being in a long-term, mutually monogamous relationship with an uninfected partner is the most reliable way to prevent STDs - Correct and consistent use of condoms can significantly reduce the risk of STDs - Limiting the number of sex partners and modifying other risky behaviors can decrease the risk of STDs - HPV vaccination is recommended for females up to age 70 and males up to age 65 (40 for men who have sex with men) - HIV testing is offered to anyone diagnosed with an STD, and pre-exposure prophylaxis may be appropriate for sexually active individuals. Post exposure prophylaxis is recommended within 72 hours of potential exposure - Please notify sexual partners if diagnosed with an STD. Expedited Partner Therapy (EPT) may be available for some infections  We are checking some labs today. I will send you a MyChart message with your results, per your preference. If you do not hear about your labs in the next 2 weeks, please call the office.  You should return to our clinic Return if symptoms worsen or fail to improve. Please arrive  15 minutes before your appointment to ensure smooth check in process.  We appreciate your efforts in making this happen.  Thank you for allowing me to participate in your care, Kathrine Melena, DO 09/25/2024, 3:01 PM PGY-2, [facility]

## 2024-09-28 ENCOUNTER — Ambulatory Visit: Payer: Self-pay | Admitting: Family Medicine

## 2024-09-28 LAB — CERVICOVAGINAL ANCILLARY ONLY
Bacterial Vaginitis (gardnerella): POSITIVE — AB
Candida Glabrata: NEGATIVE
Candida Vaginitis: POSITIVE — AB
Chlamydia: NEGATIVE
Comment: NEGATIVE
Comment: NEGATIVE
Comment: NEGATIVE
Comment: NEGATIVE
Comment: NEGATIVE
Comment: NORMAL
Neisseria Gonorrhea: NEGATIVE
Trichomonas: NEGATIVE

## 2024-09-28 LAB — URINE CULTURE

## 2024-09-28 MED ORDER — NITROFURANTOIN MONOHYD MACRO 100 MG PO CAPS: 100.0000 mg | ORAL_CAPSULE | Freq: Two times a day (BID) | ORAL | 0 refills | Status: AC

## 2024-09-28 MED ORDER — METRONIDAZOLE 500 MG PO TABS: 500.0000 mg | ORAL_TABLET | Freq: Two times a day (BID) | ORAL | 0 refills | Status: AC

## 2024-10-04 ENCOUNTER — Encounter (HOSPITAL_COMMUNITY): Payer: Self-pay

## 2024-10-04 ENCOUNTER — Other Ambulatory Visit: Payer: Self-pay

## 2024-10-04 ENCOUNTER — Ambulatory Visit (HOSPITAL_COMMUNITY)
Admission: RE | Admit: 2024-10-04 | Discharge: 2024-10-04 | Disposition: A | Discharge status: Home / Self Care | Source: Ambulatory Visit | Attending: Internal Medicine | Admitting: Internal Medicine

## 2024-10-04 VITALS — BP 103/68 | HR 82 | Temp 98.7°F | Resp 18

## 2024-10-04 DIAGNOSIS — A084 Viral intestinal infection, unspecified: Secondary | ICD-10-CM | POA: Diagnosis not present

## 2024-10-04 LAB — POCT INFLUENZA A/B
Influenza A, POC: NEGATIVE
Influenza B, POC: NEGATIVE

## 2024-10-04 LAB — POC SOFIA SARS ANTIGEN FIA: SARS Coronavirus 2 Ag: NEGATIVE

## 2024-10-04 MED ORDER — ONDANSETRON 4 MG PO TBDP: 4.0000 mg | ORAL_TABLET | Freq: Three times a day (TID) | ORAL | 0 refills | Status: AC | PRN

## 2024-10-04 NOTE — ED Provider Notes (Addendum)
 [facility]    CSN: [id number] Arrival date & time: 10/04/24  1755      History   Chief Complaint Chief Complaint  Patient presents with   Fever    Yesterday morning i woke up throwing up , having diarrhea  , and i was feeling really warm - Entered by patient   Emesis   Diarrhea    HPI Jenny Schwartz is a 28 y.o. female.   Jenny Schwartz is a 28 y.o. female presenting for chief complaint of fever, chills, nausea, vomiting, and diarrhea that started 2 days ago.  She had multiple episodes of vomiting yesterday. Last episode of emesis was 24 hours ago.  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 She is no longer nauseous and denies fever, chills, urinary symptoms, abdominal pain, blood in output, and dizziness.  Denies chest pain, shortness of breath, congestion, and cough. Denies recent intake of foods outside of normal diet and raw/undercooked vegetables.  No recent international travel or recent sick contacts with similar symptoms. She is requesting COVID-19 and influenza testing as she has a 69-year-old at home with a weakened immune system. Denies chance of pregnancy, she just darted her menstrual cycle yesterday.   Fever Associated symptoms: diarrhea and vomiting   Emesis Associated symptoms: diarrhea and fever   Diarrhea Associated symptoms: fever and vomiting     Past Medical History:  Diagnosis Date   Anemia     Patient Active Problem List   Diagnosis Date Noted   Irregular menstrual bleeding 05/09/2023   Vaginal bleeding 02/07/2023   Vaginal discharge 02/07/2023   Contraceptive management 11/14/2022   Normal labor 10/29/2022   NSVD (normal spontaneous vaginal delivery) 10/29/2022    Past Surgical History:  Procedure Laterality Date   NO PAST SURGERIES      OB History     Gravida  2   Para  2   Term  2    Preterm      AB      Living  2      SAB      IAB      Ectopic      Multiple  0   Live Births  2            Home Medications    Prior to Admission medications   Medication Sig Start Date End Date Taking? Authorizing Provider  ondansetron  (ZOFRAN -ODT) 4 MG disintegrating tablet Take 1 tablet (4 mg total) by mouth every 8 (eight) hours as needed for nausea or vomiting. 10/04/24  Yes Enedelia Dorna HERO, FNP  metroNIDAZOLE  (FLAGYL ) 500 MG tablet Take 1 tablet (500 mg total) by mouth 2 (two) times daily. 09/28/24   Janna Ferrier, DO    Family History History reviewed. No pertinent family history.  Social History Social History   Tobacco Use   Smoking status: Some Days    Types: Cigars   Smokeless tobacco: Never  Vaping Use   Vaping status: Former  Substance Use Topics   Alcohol use: No   Drug use: No     Allergies   Patient has no known allergies.   Review of Systems Review of Systems  Constitutional:  Positive for fever.  Gastrointestinal:  Positive for diarrhea and vomiting.  Per HPI   Physical Exam Triage Vital Signs ED Triage Vitals  Encounter Vitals  Group     BP 10/04/24 1850 103/68     Girls Systolic BP Percentile --      Girls Diastolic BP Percentile --      Boys Systolic BP Percentile --      Boys Diastolic BP Percentile --      Pulse Rate 10/04/24 1850 82     Resp 10/04/24 1850 18     Temp 10/04/24 1850 98.7 F (37.1 C)     Temp src --      SpO2 10/04/24 1850 96 %     Weight --      Height --      Head Circumference --      Peak Flow --      Pain Score 10/04/24 1848 0     Pain Loc --      Pain Education --      Exclude from Growth Chart --    No data found.  Updated Vital Signs BP 103/68   Pulse 82   Temp 98.7 F (37.1 C)   Resp 18   LMP 09/08/2024 (Exact Date)   SpO2 96%   Visual Acuity Right Eye Distance:   Left Eye Distance:   Bilateral Distance:    Right Eye Near:   Left Eye Near:    Bilateral Near:      Physical Exam Vitals and nursing note reviewed.  Constitutional:      Appearance: She is not ill-appearing or toxic-appearing.  HENT:     Head: Normocephalic and atraumatic.     Right Ear: Hearing, tympanic membrane, ear canal and external ear normal.     Left Ear: Hearing, tympanic membrane, ear canal and external ear normal.     Nose: Nose normal.     Mouth/Throat:     Lips: Pink.     Mouth: Mucous membranes are moist. No injury or oral lesions.     Dentition: Normal dentition.     Tongue: No lesions.     Pharynx: Oropharynx is clear. Uvula midline. No pharyngeal swelling, oropharyngeal exudate, posterior oropharyngeal erythema, uvula swelling or postnasal drip.     Tonsils: No tonsillar exudate.  Eyes:     General: Lids are normal. Vision grossly intact. Gaze aligned appropriately.     Extraocular Movements: Extraocular movements intact.     Conjunctiva/sclera: Conjunctivae normal.  Neck:     Trachea: Trachea and phonation normal.  Cardiovascular:     Rate and Rhythm: Normal rate and regular rhythm.     Heart sounds: Normal heart sounds, S1 normal and S2 normal.  Pulmonary:     Effort: Pulmonary effort is normal. No respiratory distress.     Breath sounds: Normal breath sounds and air entry.  Musculoskeletal:     Cervical back: Neck supple.  Lymphadenopathy:     Cervical: No cervical adenopathy.  Skin:    General: Skin is warm and dry.     Capillary Refill: Capillary refill takes less than 2 seconds.     Findings: No rash.  Neurological:     General: No focal deficit present.     Mental Status: She is alert and oriented to person, place, and time. Mental status is at baseline.     Cranial Nerves: No dysarthria or facial asymmetry.  Psychiatric:        Mood and Affect: Mood normal.        Speech: Speech normal.        Behavior: Behavior normal.  Thought Content: Thought content normal.        Judgment: Judgment normal.      UC Treatments / Results   Labs (all labs ordered are listed, but only abnormal results are displayed) Labs Reviewed  POCT INFLUENZA A/B  POC SOFIA SARS ANTIGEN FIA    EKG   Radiology No results found.  Procedures Procedures (including critical care time)  Medications Ordered in UC Medications - No data to display  Initial Impression / Assessment and Plan / UC Course  I have reviewed the triage vital signs and the nursing notes.  Pertinent labs & imaging results that were available during my care of the patient were reviewed by me and considered in my medical decision making (see chart for details).   1.  Viral gastroenteritis Evaluation suggests viral gastrointestinal illness etiology.  Patient nontoxic appearing with hemodynamically stable vital signs, abdominal exam without peritoneal signs/focal tenderness. Will manage this with antiemetic (Zofran ) as needed, OTC medicines as needed for discomfort/pain, increased fluids, and rest. Liquid/bland diet initially, then increase diet as tolerated.  Point-of-care COVID-19 and influenza testing is negative. Work note given.  Counseled patient on potential for adverse effects with medications prescribed/recommended today, strict ER and return-to-clinic precautions discussed, patient verbalized understanding.    Final Clinical Impressions(s) / UC Diagnoses   Final diagnoses:  Viral gastroenteritis     Discharge Instructions      Your evaluation suggests that your symptoms are most likely due to viral stomach illness (gastroenteritis/stomach bug) which will improve on its own with rest and fluids in the next few days.   Take zofran  to help with nausea every 8 hours as needed. You may use over the counter medicines for aches and pains such as tylenol  as needed.  Start sipping on liquids (broth, water, gatorade, etc). If you are able to keep liquids down without vomiting for 1-2 hours, you may eat bland foods like jello, pudding, applesauce,  bananas, rice, and white toast. Once you can tolerate blands, you may return to normal diet.   Pedialyte or gatorolyte may help to prevent/fix dehydration due to vomiting and diarrhea.  Please follow up with your primary care provider for further management. Return if you experience worsening or uncontrolled pain, inability to tolerate fluids by mouth, difficulty breathing, fevers 100.25F or greater, recurrent vomiting, or any other concerning symptoms.      ED Prescriptions     Medication Sig Dispense Auth. Provider   ondansetron  (ZOFRAN -ODT) 4 MG disintegrating tablet Take 1 tablet (4 mg total) by mouth every 8 (eight) hours as needed for nausea or vomiting. 20 tablet Enedelia Dorna HERO, FNP      PDMP not reviewed this encounter.   Enedelia Dorna HERO, FNP 10/04/24 1953    Enedelia Dorna HERO, FNP 10/04/24 1954

## 2024-10-04 NOTE — Discharge Instructions (Signed)

## 2024-10-04 NOTE — ED Triage Notes (Signed)
 PT reports she had vomiting ,diarrhea and fever started when she woke up on SAt. Pt went to work and reports she has not vomited since last nigh t. Pt has had diarrhea and 99temp.
